# Patient Record
Sex: Female | Born: 1995 | Race: Black or African American | Hispanic: No | Marital: Married | Smoking: Never smoker
Health system: Southern US, Community
[De-identification: ages and names within clinical notes are randomized; demographics above are authoritative.]

## PROBLEM LIST (undated history)

## (undated) DIAGNOSIS — F909 Attention-deficit hyperactivity disorder, unspecified type: Secondary | ICD-10-CM

## (undated) HISTORY — PX: INTRAUTERINE DEVICE INSERTION: SHX323

---

## 2016-08-11 DIAGNOSIS — D509 Iron deficiency anemia, unspecified: Secondary | ICD-10-CM | POA: Insufficient documentation

## 2016-08-11 DIAGNOSIS — J4540 Moderate persistent asthma, uncomplicated: Secondary | ICD-10-CM | POA: Insufficient documentation

## 2016-09-08 DIAGNOSIS — F411 Generalized anxiety disorder: Secondary | ICD-10-CM | POA: Insufficient documentation

## 2017-11-27 DIAGNOSIS — J45909 Unspecified asthma, uncomplicated: Secondary | ICD-10-CM | POA: Insufficient documentation

## 2017-11-27 DIAGNOSIS — D249 Benign neoplasm of unspecified breast: Secondary | ICD-10-CM | POA: Insufficient documentation

## 2019-02-26 ENCOUNTER — Encounter: Payer: Self-pay | Admitting: Sports Medicine

## 2019-02-26 ENCOUNTER — Ambulatory Visit (INDEPENDENT_AMBULATORY_CARE_PROVIDER_SITE_OTHER): Payer: 59 | Admitting: Sports Medicine

## 2019-02-26 ENCOUNTER — Other Ambulatory Visit: Payer: Self-pay

## 2019-02-26 ENCOUNTER — Ambulatory Visit (INDEPENDENT_AMBULATORY_CARE_PROVIDER_SITE_OTHER): Payer: 59

## 2019-02-26 ENCOUNTER — Other Ambulatory Visit: Payer: Self-pay | Admitting: Sports Medicine

## 2019-02-26 VITALS — BP 125/80 | Temp 97.3°F

## 2019-02-26 DIAGNOSIS — M25572 Pain in left ankle and joints of left foot: Secondary | ICD-10-CM

## 2019-02-26 DIAGNOSIS — M2142 Flat foot [pes planus] (acquired), left foot: Secondary | ICD-10-CM

## 2019-02-26 DIAGNOSIS — M2140 Flat foot [pes planus] (acquired), unspecified foot: Secondary | ICD-10-CM

## 2019-02-26 MED ORDER — MELOXICAM 7.5 MG PO TABS: 7.5000 mg | ORAL_TABLET | Freq: Every day | ORAL | 0 refills | Status: DC

## 2019-02-26 NOTE — Progress Notes (Signed)
Subjective:  Julie Hudson is a 23 y.o. female patient who presents to office for evaluation of left ankle pain. Patient complains of continued pain in the ankle for the last 3 weeks. Patient has tried icing with no relief in symptoms. Patient denies any other pedal complaints. Denies injury/trip/fall/sprain/any causative factors.   Review of Systems  All other systems reviewed and are negative.    There are no active problems to display for this patient.   Current Outpatient Medications on File Prior to Visit  Medication Sig Dispense Refill  . albuterol (VENTOLIN HFA) 108 (90 Base) MCG/ACT inhaler Inhale into the lungs.    . Norelgestromin-Eth Estradiol Julie Hudson TD) Place onto the skin.    Marland Kitchen Phentermine HCl (ADIPEX-P PO) Take by mouth.     No current facility-administered medications on file prior to visit.     Allergies  Allergen Reactions  . Fish Allergy Anaphylaxis  . Peanut Oil Anaphylaxis    Objective:  General: Alert and oriented x3 in no acute distress  Dermatology: No open lesions bilateral lower extremities, no webspace macerations, no ecchymosis bilateral, all nails x 10 are well manicured.  Vascular: Dorsalis Pedis and Posterior Tibial pedal pulses palpable, Capillary Fill Time 3 seconds,(+) pedal hair growth bilateral, no edema bilateral lower extremities, Temperature gradient within normal limits.  Neurology: Julie Hudson sensation intact via light touch bilateral.  Musculoskeletal: Mild tenderness with palpation at medial and lateral ankle gutters with marked pes planus deformity with forefoot abduction and lateral impaction at the lateral ankle left greater than right. Negative talar tilt, Negative tib-fib stress, No instability. No pain with calf compression bilateral. Range of motion within normal limits with mild guarding on left ankle. Strength within normal limits in all groups bilateral.   Gait: Antalgic gait  Xrays  Left Ankle   Impression: Normal osseous  mineralization on the lateral view there is significant promontory influence with midtarsal collapse supportive of pes planus deformity with forefoot abduction.  Assessment and Plan: Problem List Items Addressed This Visit    None    Visit Diagnoses    Left ankle pain, unspecified chronicity    -  Primary   Relevant Medications   meloxicam (MOBIC) 7.5 MG tablet   Pes planus, unspecified laterality          -Complete examination performed -Xrays reviewed -Discussed treatement options for pain likely secondary to phonatory flatfoot with impingement -Rx meloxicam for patient to take daily -Recommend good supportive shoes for foot type and advised patient if fails to continue to improve may benefit from custom functional foot orthotics; Dawn to call patient to arrange casting after benefits are discussed -Patient to return to office for Orthotics or sooner if condition worsens.  Landis Martins, DPM

## 2019-03-11 ENCOUNTER — Telehealth: Payer: Self-pay | Admitting: Sports Medicine

## 2019-03-11 ENCOUNTER — Other Ambulatory Visit: Payer: Self-pay | Admitting: Sports Medicine

## 2019-03-11 MED ORDER — MELOXICAM 15 MG PO TABS: 15.0000 mg | ORAL_TABLET | Freq: Every day | ORAL | 0 refills | Status: DC

## 2019-03-11 NOTE — Progress Notes (Signed)
Sent 15 mg to pharmacy

## 2019-03-11 NOTE — Telephone Encounter (Signed)
Sent the 15mg  strength to her pharmacy

## 2019-03-11 NOTE — Telephone Encounter (Signed)
Pt returned my call regarding orthotics and is aware they are not covered under her plan and is aware of the cost and payment plan.  Pt then asked if I could let you know that the meloxicam 7.5 is not working and she wanted to know if you could increase the dosage or change to a different medication. [facility] [address] [phone].

## 2019-03-20 ENCOUNTER — Other Ambulatory Visit: Payer: Self-pay | Admitting: Sports Medicine

## 2019-03-20 DIAGNOSIS — M25572 Pain in left ankle and joints of left foot: Secondary | ICD-10-CM

## 2019-04-01 ENCOUNTER — Other Ambulatory Visit: Payer: Self-pay | Admitting: Sports Medicine

## 2019-04-02 ENCOUNTER — Telehealth: Payer: Self-pay | Admitting: *Deleted

## 2019-04-02 DIAGNOSIS — F902 Attention-deficit hyperactivity disorder, combined type: Secondary | ICD-10-CM | POA: Insufficient documentation

## 2019-04-02 DIAGNOSIS — F909 Attention-deficit hyperactivity disorder, unspecified type: Secondary | ICD-10-CM | POA: Insufficient documentation

## 2019-04-02 NOTE — Telephone Encounter (Signed)
Dr. Cannon Kettle approved patient to receive a refill for:  Meloxicam 7.5 mg Dispensed 30 with 1 refill  This was faxed over to patient's pharmacy at:  [place] [place], [place]

## 2020-09-18 ENCOUNTER — Emergency Department (HOSPITAL_BASED_OUTPATIENT_CLINIC_OR_DEPARTMENT_OTHER)
Admission: EM | Admit: 2020-09-18 | Discharge: 2020-09-18 | Disposition: A | Discharge status: Home / Self Care | Payer: 59 | Attending: Emergency Medicine | Admitting: Emergency Medicine

## 2020-09-18 ENCOUNTER — Encounter (HOSPITAL_BASED_OUTPATIENT_CLINIC_OR_DEPARTMENT_OTHER): Payer: Self-pay | Admitting: *Deleted

## 2020-09-18 ENCOUNTER — Other Ambulatory Visit: Payer: Self-pay

## 2020-09-18 ENCOUNTER — Emergency Department (HOSPITAL_BASED_OUTPATIENT_CLINIC_OR_DEPARTMENT_OTHER): Payer: 59

## 2020-09-18 DIAGNOSIS — Z91010 Allergy to peanuts: Secondary | ICD-10-CM | POA: Diagnosis not present

## 2020-09-18 DIAGNOSIS — M62830 Muscle spasm of back: Secondary | ICD-10-CM | POA: Insufficient documentation

## 2020-09-18 DIAGNOSIS — N939 Abnormal uterine and vaginal bleeding, unspecified: Secondary | ICD-10-CM | POA: Diagnosis not present

## 2020-09-18 DIAGNOSIS — M542 Cervicalgia: Secondary | ICD-10-CM | POA: Insufficient documentation

## 2020-09-18 DIAGNOSIS — R109 Unspecified abdominal pain: Secondary | ICD-10-CM | POA: Insufficient documentation

## 2020-09-18 HISTORY — DX: Attention-deficit hyperactivity disorder, unspecified type: F90.9

## 2020-09-18 LAB — BASIC METABOLIC PANEL
Anion gap: 9 (ref 5–15)
BUN: 12 mg/dL (ref 6–20)
CO2: 25 mmol/L (ref 22–32)
Calcium: 8.9 mg/dL (ref 8.9–10.3)
Chloride: 104 mmol/L (ref 98–111)
Creatinine, Ser: 0.9 mg/dL (ref 0.44–1.00)
GFR, Estimated: >60 mL/min (ref >60)
Glucose, Bld: 87 mg/dL (ref 70–99)
Potassium: 3.8 mmol/L (ref 3.5–5.1)
Sodium: 138 mmol/L (ref 135–145)

## 2020-09-18 LAB — URINALYSIS, ROUTINE W REFLEX MICROSCOPIC
Bilirubin Urine: NEGATIVE
Glucose, UA: NEGATIVE mg/dL
Ketones, ur: NEGATIVE mg/dL
Leukocytes,Ua: NEGATIVE
Nitrite: NEGATIVE
Protein, ur: NEGATIVE mg/dL
Specific Gravity, Urine: 1.025 (ref 1.005–1.030)
pH: 6 (ref 5.0–8.0)

## 2020-09-18 LAB — CBC WITH DIFFERENTIAL/PLATELET
Abs Immature Granulocytes: 0.04 10*3/uL (ref 0.00–0.07)
Basophils Absolute: 0.1 10*3/uL (ref 0.0–0.1)
Basophils Relative: 1 %
Eosinophils Absolute: 0.6 10*3/uL — ABNORMAL HIGH (ref 0.0–0.5)
Eosinophils Relative: 8 %
HCT: 42.6 % (ref 36.0–46.0)
Hemoglobin: 13.8 g/dL (ref 12.0–15.0)
Immature Granulocytes: 1 %
Lymphocytes Relative: 24 %
Lymphs Abs: 1.8 10*3/uL (ref 0.7–4.0)
MCH: 30.9 pg (ref 26.0–34.0)
MCHC: 32.4 g/dL (ref 30.0–36.0)
MCV: 95.3 fL (ref 80.0–100.0)
Monocytes Absolute: 0.9 10*3/uL (ref 0.1–1.0)
Monocytes Relative: 11 %
Neutro Abs: 4.2 10*3/uL (ref 1.7–7.7)
Neutrophils Relative %: 55 %
Platelets: 403 10*3/uL — ABNORMAL HIGH (ref 150–400)
RBC: 4.47 MIL/uL (ref 3.87–5.11)
RDW: 12.9 % (ref 11.5–15.5)
WBC: 7.6 10*3/uL (ref 4.0–10.5)
nRBC: 0 % (ref 0.0–0.2)

## 2020-09-18 LAB — URINALYSIS, MICROSCOPIC (REFLEX): RBC / HPF: >50 RBC/hpf (ref 0–5)

## 2020-09-18 LAB — PREGNANCY, URINE: Preg Test, Ur: NEGATIVE

## 2020-09-18 MED ORDER — MEGESTROL ACETATE 40 MG PO TABS: 40.0000 mg | ORAL_TABLET | Freq: Three times a day (TID) | ORAL | 0 refills | Status: AC

## 2020-09-18 MED ORDER — SODIUM CHLORIDE 0.9 % IV BOLUS
1000.0000 mL | Freq: Once | INTRAVENOUS | Status: AC
  Administered 2020-09-18: 1000 mL via INTRAVENOUS

## 2020-09-18 NOTE — ED Triage Notes (Signed)
Vaginal bleeding x 3 months. She is being treated for the bleeding with hormones with no improvement. Abdominal and back pain.

## 2020-09-18 NOTE — ED Notes (Signed)
In [place], will get vitals when she returns from radiology

## 2020-09-18 NOTE — ED Provider Notes (Signed)
[facility] EMERGENCY DEPARTMENT Provider Note   CSN: [id number] Arrival date & time: 09/18/20  1314     History Chief Complaint  Patient presents with  . Vaginal Bleeding    Julie Hudson is a 25 y.o. female who presents with concern for nearly 6 weeks of persistent vaginal bleeding.  Patient's been seen at multiple urgent cares and at OB/GYN and has been prescribed Aygestin, which lightened her uterine bleeding, but caused her severe headaches.  She was changed to Provera without improvement in her bleeding.  Patient with history of the same approximately 10 months ago, refractory to Provera but resolved with Aygestin.  At that time she had no adverse reactions to Aygestin.  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  She endorses significant discomfort with pelvic exams as well.  Currently using vaginal dilator to try to prove her vaginismus symptoms.  Patient is not sexually active due to her vaginismus, denies any new vaginal discharge, states she is changing her pad 3 times a day at this point but was changing it 3-4 times per day for the few weeks prior to today.  Additionally patient endorses progressively worsening mid back and neck pain for the last several weeks, sensation of tightness in her muscles. Denies any numbness, tingling, weakness in her upper extremities, denies any recent trauma or change in activity, denies any tenderness on her spine itself.  I personally read this patient's medical records.  She is history of abnormal uterine bleeding and severe food allergies, as well as ADHD on Adderall.  HPI     Past Medical History:  Diagnosis Date  . ADHD     There are no problems to display for this patient.   History reviewed. No pertinent surgical history.   OB History   No obstetric history on file.     No family history on file.  Social History   Tobacco Use  . Smoking status: Never  Smoker  . Smokeless tobacco: Never Used  Substance Use Topics  . Alcohol use: Yes  . Drug use: Never    Home Medications Prior to Admission medications   Medication Sig Start Date End Date Taking? Authorizing Provider  albuterol (VENTOLIN HFA) 108 (90 Base) MCG/ACT inhaler Inhale into the lungs. 09/29/16  Yes [provider]  megestrol (MEGACE) 40 MG tablet Take 1 tablet (40 mg total) by mouth in the morning, at noon, and at bedtime for 7 days. 09/18/20 09/25/20 Yes Jenne Sellinger, Eugene Gavia, PA-C  meloxicam (MOBIC) 15 MG tablet TAKE 1 TABLET BY MOUTH EVERY DAY 04/01/19   Asencion Islam, DPM  Phentermine HCl (ADIPEX-P PO) Take by mouth.    [provider]    Allergies    Fish allergy and Peanut oil  Review of Systems   Review of Systems  Constitutional: Negative.   HENT: Negative.   Respiratory: Negative.   Cardiovascular: Negative.   Gastrointestinal: Negative for abdominal pain, blood in stool, constipation, diarrhea, nausea and vomiting.  Genitourinary: Positive for dyspareunia, menstrual problem, pelvic pain, vaginal bleeding and vaginal pain. Negative for decreased urine volume, dysuria, enuresis, flank pain, frequency, genital sores, hematuria, urgency and vaginal discharge.  Musculoskeletal: Positive for back pain.  Skin: Negative.   Neurological: Negative.   Psychiatric/Behavioral: Negative.     Physical Exam Updated Vital Signs BP (!) 122/91 (BP Location: Right Arm)   Pulse (!) 107   Temp 98.3 F (36.8 C) (Oral)   Resp 18  Ht 5\' 2"  (1.575 m)   Wt 88.6 kg   LMP 08/23/2020   SpO2 100%   BMI 35.74 kg/m   Physical Exam Vitals and nursing note reviewed.  HENT:     Head: Normocephalic and atraumatic.     Nose: Nose normal.     Mouth/Throat:     Mouth: Mucous membranes are moist.     Pharynx: Oropharynx is clear. Uvula midline. No oropharyngeal exudate or posterior oropharyngeal erythema.     Tonsils: No tonsillar exudate.  Eyes:     General: Lids  are normal. Vision grossly intact.        Right eye: No discharge.        Left eye: No discharge.     Conjunctiva/sclera: Conjunctivae normal.     Pupils: Pupils are equal, round, and reactive to light.  Neck:     Trachea: Trachea and phonation normal.  Cardiovascular:     Rate and Rhythm: Normal rate and regular rhythm.     Pulses: Normal pulses.          Radial pulses are 2+ on the right side and 2+ on the left side.       Dorsalis pedis pulses are 2+ on the right side and 2+ on the left side.     Heart sounds: Normal heart sounds. No murmur heard.   Pulmonary:     Effort: Pulmonary effort is normal. No respiratory distress.     Breath sounds: Normal breath sounds. No wheezing or rales.  Chest:     Chest wall: No deformity, swelling, tenderness or crepitus.  Abdominal:     General: Bowel sounds are normal. There is no distension.     Palpations: Abdomen is soft.     Tenderness: There is abdominal tenderness in the suprapubic area. There is no right CVA tenderness or left CVA tenderness.  Genitourinary:    Comments: GU exam deferred given patient's hx of vaginismus, exam not tolerable. Recent normal exam at outpatient facility last week.  Musculoskeletal:        General: No deformity.     Cervical back: Neck supple. Spasms and tenderness present. No bony tenderness or crepitus. No pain with movement or spinous process tenderness.     Thoracic back: Spasms and tenderness present. No bony tenderness.     Lumbar back: Spasms present. No tenderness or bony tenderness.     Right lower leg: No edema.     Left lower leg: No edema.     Comments: Bilateral cervical and thoracic paraspinous musculature spasm and tenderness to palpation. Bilateral trapezius spasm and tenderness to palpation. No midline tenderness to palpation. No trigger point.  Lymphadenopathy:     Cervical: No cervical adenopathy.  Skin:    General: Skin is warm and dry.     Capillary Refill: Capillary refill takes less  than 2 seconds.  Neurological:     General: No focal deficit present.     Mental Status: She is alert and oriented to person, place, and time. Mental status is at baseline.     Sensory: Sensation is intact.     Motor: Motor function is intact.     Gait: Gait is intact.  Psychiatric:        Mood and Affect: Mood normal.     ED Results / Procedures / Treatments   Labs (all labs ordered are listed, but only abnormal results are displayed) Labs Reviewed  CBC WITH DIFFERENTIAL/PLATELET - Abnormal; Notable for the following components:  Result Value   Platelets 403 (*)    Eosinophils Absolute 0.6 (*)    All other components within normal limits  URINALYSIS, ROUTINE W REFLEX MICROSCOPIC - Abnormal; Notable for the following components:   Color, Urine AMBER (*)    APPearance HAZY (*)    Hgb urine dipstick LARGE (*)    All other components within normal limits  URINALYSIS, MICROSCOPIC (REFLEX) - Abnormal; Notable for the following components:   Bacteria, UA RARE (*)    All other components within normal limits  BASIC METABOLIC PANEL  PREGNANCY, URINE    EKG None  Radiology US Pelvis Complete  Result Date: 09/18/2020 CLINICAL DATA:  Vaginal bleeding for 1 month, back pain EXAM: TRANSABDOMINAL ULTRASOUND OF PELVIS TECHNIQUE: Transabdominal ultrasound examination of the pelvis was performed including evaluation of the uterus, ovaries, adnexal regions, and pelvic cul-de-sac. COMPARISON:  None. FINDINGS: Uterus Measurements: Uterus measures 7.7 x 3.9 by 4.5 cm = volume: 71.0 mL. No gross abnormalities. Evaluation is limited due to patient body habitus. The patient refused endovaginal exam. Endometrium Thickness: 9 mm. No gross abnormality identified. Evaluation limited due to body habitus. The patient refused endovaginal exam. Right ovary Measurements: 2.9 x 1.5 x 1.9 cm = volume: 4.3 mL. Normal appearance/no adnexal mass. Left ovary Measurements: 2.9 x 1.6 x 2.2 cm = volume: 5.1 mL.  Normal appearance/no adnexal mass. Other findings: No free fluid. The bladder is grossly unremarkable. IMPRESSION: 1. Limited study due to body habitus. The patient refused endovaginal examination. 2. Age-appropriate pelvic ultrasound. Electronically Signed   By: Sharlet Salina M.D.   On: 09/18/2020 18:01    Procedures Procedures  Medications Ordered in ED Medications  sodium chloride 0.9 % bolus 1,000 mL (0 mLs Intravenous Stopped 09/18/20 1830)    ED Course  I have reviewed the triage vital signs and the nursing notes.  Pertinent labs & imaging results that were available during my care of the patient were reviewed by me and considered in my medical decision making (see chart for details).    MDM Rules/Calculators/A&P                         25 year old female with history of abnormal brain who presents with 6 weeks of vaginal bleeding refractory to Provera and Aygestin.  Some associated suprapubic tenderness palpation.  Tachycardic to 130 hypertensive on intake, vital signs otherwise normal, at time of my exam patient remains mildly tachycardic with heart rate around 110, pulmonary exam is normal, abdominal exam with suprapubic tenderness to palpation.  GU exam deferred given patient's baseline vaginismus and recent normal pelvic exam last week. MSK exam revealed cervical and thoracic paraspinous muscular spasm and tenderness to palpation, without midline tenderness. No concern for STI given patient is not sexually active given her vaginismus.  Will proceed with basic laboratory studies to confirm patient is not severely anemic as well as transabdominal pelvic ultrasound.  CBC unremarkable, hemoglobin normal at 13.8, BMP unremarkable. UA with  large amount of blood, rare bacteria, not concerning for infection at this time.  Pelvic ultrasound performed, however limited due to patient's body habitus and inability to perform transvaginal portion.  Visualized portions of the pelvis revealed  age-appropriate pelvic exam.  Consult placed to on-call OB/GYN, Dr. Elroy Channel, regarding appropriate next step for treatment of abnormal uterine bleeding.  He recommends prescription for Megace 40 mg 3 times daily x7 days as well as OB/GYN follow-up within the week.  Appreciate his collaboration of  care of this patient.  Suspect patient's back pain is secondary to muscle spasm, likely secondary to poor posture.  Education given regarding topical analgesia and application of heat and massage for release of muscle spasm.  No further work-up is warranted in ED at this time given reassuring physical exam, vital signs, laboratory studies.  Suspect patient's back pain is due to muscular spasm and poor posture.  Etiology of abnormal uterine bleeding remains unclear, however there does not appear to be any emergent source for this patient's issue and her hemoglobin is stable at this time.  Borderline tachycardia with heart rate of 105; recommend patient follow-up with OB/GYN within the week.  May need evaluation of thyroid function.  Ernisha voiced understanding of her medical evaluation treatment plan.  Each of her questions was answered to her expressed aspect.  Return precautions given.  Patient is well-appearing, stable, and appropriate for discharge at this time.  This chart was dictated using voice recognition software, Dragon. Despite the best efforts of this provider to proofread and correct errors, errors may still occur which can change documentation meaning.  Final Clinical Impression(s) / ED Diagnoses Final diagnoses:  Abnormal uterine bleeding  Spasm of thoracic back muscle    Rx / DC Orders ED Discharge Orders         Ordered    megestrol (MEGACE) 40 MG tablet  3 times daily        09/18/20 1855           Chaim Gatley, Eugene GaviaRebekah R, PA-C 09/18/20 1944    Charlynne PanderYao, David Hsienta, MD 09/19/20 1450

## 2020-09-18 NOTE — Discharge Instructions (Addendum)
You were seen in the emergency department for your vaginal bleeding and your back pain.  Your blood work, vital signs, physical exam, and abdominal ultrasound were reassuring.  I discussed your case with the on-call OB/GYN who recommended changing you to a medication called Megace, which you will take 3 times a day until your bleeding stops.  Please discontinue the other medications prescribed to you for your vaginal bleeding.  You should be seen by your OB/GYN within the next week for follow-up.  The muscles in your mid back and neck are in what is called spasm, meaning they are inappropriately tightened up.  This can be quite painful.  To help with your pain you may take Tylenol and / or NSAID medication (such as ibuprofen or naproxen) to help with your pain.    You may also utilize topical pain relief such as Biofreeze, IcyHot, or topical lidocaine patches.  I also recommend that you apply heat to the area, such as a hot shower or heating pad, and follow heat application with massage of the muscles that are most tight.  Please return to the emergency department if you develop any numbness/tingling/weakness in your arms or legs, any difficulty urinating, or urinary incontinence chest pain, shortness of breath, abdominal pain, nausea or vomiting that does not stop, severe pelvic pain, for vaginal bleeding persists or becomes heavier, or any other new severe symptoms.

## 2020-09-20 ENCOUNTER — Emergency Department (HOSPITAL_BASED_OUTPATIENT_CLINIC_OR_DEPARTMENT_OTHER): Payer: 59

## 2020-09-20 ENCOUNTER — Encounter (HOSPITAL_BASED_OUTPATIENT_CLINIC_OR_DEPARTMENT_OTHER): Payer: Self-pay | Admitting: Emergency Medicine

## 2020-09-20 ENCOUNTER — Emergency Department (HOSPITAL_BASED_OUTPATIENT_CLINIC_OR_DEPARTMENT_OTHER)
Admission: EM | Admit: 2020-09-20 | Discharge: 2020-09-20 | Disposition: A | Discharge status: Home / Self Care | Payer: 59 | Attending: Emergency Medicine | Admitting: Emergency Medicine

## 2020-09-20 ENCOUNTER — Other Ambulatory Visit: Payer: Self-pay

## 2020-09-20 DIAGNOSIS — R0602 Shortness of breath: Secondary | ICD-10-CM | POA: Diagnosis not present

## 2020-09-20 DIAGNOSIS — N939 Abnormal uterine and vaginal bleeding, unspecified: Secondary | ICD-10-CM | POA: Diagnosis not present

## 2020-09-20 DIAGNOSIS — Z91040 Latex allergy status: Secondary | ICD-10-CM | POA: Diagnosis not present

## 2020-09-20 DIAGNOSIS — G8929 Other chronic pain: Secondary | ICD-10-CM | POA: Diagnosis not present

## 2020-09-20 DIAGNOSIS — Z20822 Contact with and (suspected) exposure to covid-19: Secondary | ICD-10-CM | POA: Insufficient documentation

## 2020-09-20 DIAGNOSIS — R000 Tachycardia, unspecified: Secondary | ICD-10-CM | POA: Diagnosis not present

## 2020-09-20 DIAGNOSIS — R079 Chest pain, unspecified: Secondary | ICD-10-CM | POA: Diagnosis not present

## 2020-09-20 DIAGNOSIS — M549 Dorsalgia, unspecified: Secondary | ICD-10-CM | POA: Diagnosis not present

## 2020-09-20 LAB — CBC
HCT: 43 % (ref 36.0–46.0)
Hemoglobin: 14.1 g/dL (ref 12.0–15.0)
MCH: 30.9 pg (ref 26.0–34.0)
MCHC: 32.8 g/dL (ref 30.0–36.0)
MCV: 94.1 fL (ref 80.0–100.0)
Platelets: 412 10*3/uL — ABNORMAL HIGH (ref 150–400)
RBC: 4.57 MIL/uL (ref 3.87–5.11)
RDW: 12.6 % (ref 11.5–15.5)
WBC: 6.9 10*3/uL (ref 4.0–10.5)
nRBC: 0 % (ref 0.0–0.2)

## 2020-09-20 LAB — BASIC METABOLIC PANEL
Anion gap: 11 (ref 5–15)
BUN: 13 mg/dL (ref 6–20)
CO2: 20 mmol/L — ABNORMAL LOW (ref 22–32)
Calcium: 9.3 mg/dL (ref 8.9–10.3)
Chloride: 104 mmol/L (ref 98–111)
Creatinine, Ser: 0.81 mg/dL (ref 0.44–1.00)
GFR, Estimated: >60 mL/min (ref >60)
Glucose, Bld: 112 mg/dL — ABNORMAL HIGH (ref 70–99)
Potassium: 3.8 mmol/L (ref 3.5–5.1)
Sodium: 135 mmol/L (ref 135–145)

## 2020-09-20 LAB — TROPONIN I (HIGH SENSITIVITY): Troponin I (High Sensitivity): <2 ng/L (ref <18)

## 2020-09-20 LAB — PREGNANCY, URINE: Preg Test, Ur: NEGATIVE

## 2020-09-20 MED ORDER — LIDOCAINE 5 % EX PTCH: 1.0000 | MEDICATED_PATCH | CUTANEOUS | 0 refills | Status: DC

## 2020-09-20 NOTE — ED Notes (Signed)
Concerned that she has covid - states back pain, cough, congestion and runny nose since Tuesday. Denies fevers.

## 2020-09-20 NOTE — ED Notes (Signed)
Covid Swab obtained and to the lab 

## 2020-09-20 NOTE — ED Provider Notes (Signed)
[facility] EMERGENCY DEPARTMENT Provider Note   CSN: [id number] Arrival date & time: 09/20/20  1830     History Chief Complaint  Patient presents with  . Vaginal Bleeding  . Shortness of Breath  . Abdominal Cramping    Julie Hudson is a 25 y.o. female hx of ADHD, menorrhagia here with multiple complaints. Patient has been having vaginal bleeding for several weeks. Was seen here  2 days ago and [place] was unremarkable. She was prescribed megace. Started taking it and was getting chest pains since yesterday. Subjective shortness of breath as well. 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 She also has possible covid exposure. She is fully vaccinated and went back to [place] and went to [employer]. She also has lower back pain for several weeks and tried tylenol and icy hot patches with no relief. She states that she had some numbness this morning when she woke up but has no numbness right now. Denies history of stroke.   The history is provided by the patient.       Past Medical History:  Diagnosis Date  . ADHD     There are no problems to display for this patient.   History reviewed. No pertinent surgical history.   OB History   No obstetric history on file.     No family history on file.  Social History   Tobacco Use  . Smoking status: Never Smoker  . Smokeless tobacco: Never Used  Substance Use Topics  . Alcohol use: Yes  . Drug use: Never    Home Medications Prior to Admission medications   Medication Sig Start Date End Date Taking? Authorizing Provider  lidocaine (LIDODERM) 5 % Place 1 patch onto the skin daily. Remove & Discard patch within 12 hours or as directed by MD 09/20/20  Yes Charlynne Pander, MD  albuterol (VENTOLIN HFA) 108 (90 Base) MCG/ACT inhaler Inhale into the lungs. 09/29/16   [provider]  megestrol (MEGACE) 40 MG tablet Take 1 tablet (40 mg total) by  mouth in the morning, at noon, and at bedtime for 7 days. 09/18/20 09/25/20  Sponseller, Lupe Carney R, PA-C  meloxicam (MOBIC) 15 MG tablet TAKE 1 TABLET BY MOUTH EVERY DAY 04/01/19   Asencion Islam, DPM  Phentermine HCl (ADIPEX-P PO) Take by mouth.    [provider]    Allergies    Fish allergy and Peanut oil  Review of Systems   Review of Systems  Respiratory: Positive for shortness of breath.   Genitourinary: Positive for vaginal bleeding.  All other systems reviewed and are negative.   Physical Exam Updated Vital Signs BP 139/89 (BP Location: Right Arm)   Pulse 95   Temp 98.5 F (36.9 C) (Oral)   Resp 18   Ht 5\' 2"  (1.575 m)   Wt 86.2 kg   LMP 08/23/2020   SpO2 100%   BMI 34.75 kg/m   Physical Exam Vitals and nursing note reviewed.  Constitutional:      Comments: Slightly uncomfortable   HENT:     Head: Normocephalic.  Eyes:     Pupils: Pupils are equal, round, and reactive to light.  Cardiovascular:     Rate and Rhythm: Regular rhythm. Tachycardia present.  Pulmonary:     Effort: Pulmonary effort is normal.     Breath sounds: Normal breath sounds.  Abdominal:     General: Bowel sounds  are normal.     Palpations: Abdomen is soft.  Musculoskeletal:        General: Normal range of motion.     Cervical back: Normal range of motion.  Skin:    General: Skin is warm.     Capillary Refill: Capillary refill takes less than 2 seconds.  Neurological:     General: No focal deficit present.     Mental Status: She is alert and oriented to person, place, and time.  Psychiatric:        Mood and Affect: Mood normal.        Behavior: Behavior normal.     ED Results / Procedures / Treatments   Labs (all labs ordered are listed, but only abnormal results are displayed) Labs Reviewed  BASIC METABOLIC PANEL - Abnormal; Notable for the following components:      Result Value   CO2 20 (*)    Glucose, Bld 112 (*)    All other components within normal limits  CBC -  Abnormal; Notable for the following components:   Platelets 412 (*)    All other components within normal limits  SARS CORONAVIRUS 2 (TAT 6-24 HRS)  PREGNANCY, URINE  TROPONIN I (HIGH SENSITIVITY)  TROPONIN I (HIGH SENSITIVITY)    EKG EKG Interpretation  Date/Time:  Sunday September 20 2020 18:48:04 EST Ventricular Rate:  123 PR Interval:  114 QRS Duration: 74 QT Interval:  318 QTC Calculation: 455 R Axis:   87 Text Interpretation: Sinus tachycardia T wave abnormality, consider anterior ischemia Abnormal ECG No previous ECGs available Confirmed by Richardean Canal [phone]) on 09/20/2020 9:25:52 PM   Radiology DG Chest 2 View  Result Date: 09/20/2020 CLINICAL DATA:  Chest pain and shortness of breath. EXAM: CHEST - 2 VIEW COMPARISON:  None. FINDINGS: The cardiomediastinal contours are normal. The lungs are clear. Pulmonary vasculature is normal. No consolidation, pleural effusion, or pneumothorax. No acute osseous abnormalities are seen. IMPRESSION: Negative radiographs of the chest. Electronically Signed   By: Narda Rutherford M.D.   On: 09/20/2020 19:35    Procedures Procedures   Medications Ordered in ED Medications - No data to display  ED Course  I have reviewed the triage vital signs and the nursing notes.  Pertinent labs & imaging results that were available during my care of the patient were reviewed by me and considered in my medical decision making (see chart for details).    MDM Rules/Calculators/A&P                         Julie Hudson is a 25 y.o. female here with constellation of complaints.  1.  She has vaginal bleeding for several weeks and recently had [place] and seen by OB and is on megace. Her Hg today is 14 which is better than 2 days ago.   2. Possible COVID exposure in [place]- not hypoxic and CXR is clear. COVID test send and told her to stay home per guidelines if positive. She is fully vaccinated.   3. Chronic back pain- no red flags. Will try lidocaine  patch   4. Chest pain this morning- likely medication side effect. Told her to stop taking megace and call her GYN   5. Numbness- Intermittent numbness. No numbness on my exam, no neuro deficits. Electrolytes unremarkable. She has exam tomorrow and appears anxious. No signs of stroke.    Final Clinical Impression(s) / ED Diagnoses Final diagnoses:  Vaginal bleeding  COVID-19 virus RNA test result unknown  Back pain, unspecified back location, unspecified back pain laterality, unspecified chronicity    Rx / DC Orders ED Discharge Orders         Ordered    lidocaine (LIDODERM) 5 %  Every 24 hours        09/20/20 2137           Charlynne Pander, MD 09/20/20 2146

## 2020-09-20 NOTE — ED Triage Notes (Signed)
Seen two days ago for vaginal bleeding.  Today having multiple complaints.  Reports having intermittent numbness to left side of her body, intermittent chest pain, sob, reports period has been going on for a month.

## 2020-09-20 NOTE — ED Notes (Signed)
AVS and Rx x 1 provided to pt as written by ED Provider. Opportunity for questions provided

## 2020-09-20 NOTE — Discharge Instructions (Addendum)
Your hemoglobin is better than earlier this week. You may have side effects from megace. Stop it for now and call your GYN doctor about treatment options for your vaginal bleeding   Apply lidocaine patch to the back   Take tylenol for pain   You were tested for COVID. If you have COVID, you will need to quarantine per guidelines   See your GYN doctor   Return to ER if you have worse vaginal bleeding, abdominal pain, vomiting, fever, trouble breathing.

## 2020-09-21 LAB — SARS CORONAVIRUS 2 (TAT 6-24 HRS): SARS Coronavirus 2: NEGATIVE

## 2021-04-05 ENCOUNTER — Ambulatory Visit
Admission: EM | Admit: 2021-04-05 | Discharge: 2021-04-05 | Disposition: A | Discharge status: Home / Self Care | Payer: 59

## 2021-04-05 ENCOUNTER — Other Ambulatory Visit: Payer: Self-pay

## 2021-04-05 DIAGNOSIS — M7918 Myalgia, other site: Secondary | ICD-10-CM

## 2021-04-05 DIAGNOSIS — J069 Acute upper respiratory infection, unspecified: Secondary | ICD-10-CM | POA: Diagnosis not present

## 2021-04-05 MED ORDER — OLOPATADINE HCL 0.1 % OP SOLN: 1.0000 [drp] | Freq: Two times a day (BID) | OPHTHALMIC | 0 refills | Status: DC

## 2021-04-05 MED ORDER — NAPROXEN 500 MG PO TABS: 500.0000 mg | ORAL_TABLET | Freq: Two times a day (BID) | ORAL | 0 refills | Status: DC

## 2021-04-05 MED ORDER — FLUTICASONE PROPIONATE 50 MCG/ACT NA SUSP: 1.0000 | Freq: Every day | NASAL | 0 refills | Status: DC

## 2021-04-05 NOTE — Discharge Instructions (Addendum)
COVID test pending, monitor MyChart for results Rest and fluids Restart daily Allegra-D Trial Flonase nasal spray 1 to 2 spray in each nostril daily Naprosyn as needed for gluteal pain/headache/sinus pressure Rest and fluids Please follow-up if not improving or worsening
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
## 2021-04-06 LAB — NOVEL CORONAVIRUS, NAA: SARS-CoV-2, NAA: NOT DETECTED

## 2021-04-06 LAB — SARS-COV-2, NAA 2 DAY TAT

## 2021-04-20 ENCOUNTER — Other Ambulatory Visit: Payer: Self-pay

## 2021-04-20 ENCOUNTER — Ambulatory Visit
Admission: RE | Admit: 2021-04-20 | Discharge: 2021-04-20 | Disposition: A | Discharge status: Home / Self Care | Payer: 59 | Source: Ambulatory Visit | Attending: Emergency Medicine | Admitting: Emergency Medicine

## 2021-04-20 VITALS — BP 124/84 | HR 109 | Temp 98.3°F | Resp 18

## 2021-04-20 DIAGNOSIS — J309 Allergic rhinitis, unspecified: Secondary | ICD-10-CM

## 2021-04-20 DIAGNOSIS — F410 Panic disorder [episodic paroxysmal anxiety] without agoraphobia: Secondary | ICD-10-CM

## 2021-04-20 DIAGNOSIS — J4520 Mild intermittent asthma, uncomplicated: Secondary | ICD-10-CM | POA: Diagnosis not present

## 2021-04-20 DIAGNOSIS — F900 Attention-deficit hyperactivity disorder, predominantly inattentive type: Secondary | ICD-10-CM

## 2021-04-20 MED ORDER — ALBUTEROL SULFATE HFA 108 (90 BASE) MCG/ACT IN AERS
6.0000 | INHALATION_SPRAY | Freq: Once | RESPIRATORY_TRACT | Status: AC
  Administered 2021-04-20: 6 via RESPIRATORY_TRACT

## 2021-04-20 MED ORDER — FEXOFENADINE HCL 180 MG PO TABS: 180.0000 mg | ORAL_TABLET | Freq: Every day | ORAL | 0 refills | Status: DC

## 2021-04-20 MED ORDER — FLUTICASONE PROPIONATE 50 MCG/ACT NA SUSP: 1.0000 | Freq: Every day | NASAL | 0 refills | Status: DC

## 2021-04-20 MED ORDER — ALBUTEROL SULFATE 1.25 MG/3ML IN NEBU: 1.0000 | INHALATION_SOLUTION | Freq: Four times a day (QID) | RESPIRATORY_TRACT | 0 refills | Status: DC | PRN

## 2021-04-20 MED ORDER — ALBUTEROL SULFATE HFA 108 (90 BASE) MCG/ACT IN AERS: 2.0000 | INHALATION_SPRAY | RESPIRATORY_TRACT | 0 refills | Status: AC | PRN

## 2021-04-20 NOTE — Discharge Instructions (Addendum)
Resume Flonase and fexofenadine for your allergic rhinitis.  I have renewed your prescriptions for albuterol inhaler as well as nebulizer treatment.  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  Thank you for visiting urgent care today.

## 2021-04-20 NOTE — ED Triage Notes (Signed)
Pt reports this morning she believes she has a panic or asthma attack. She did take 2 puffs of her inhaler. Patient reports she has some chest tightness at this time with no SOB at this time. .  Patient displays no s/s of respiratory distress at this time. She is A&O x 4, and ambulatory.

## 2021-04-20 NOTE — ED Provider Notes (Signed)
[facility]    CSN: [id number] Arrival date & time: 04/20/21  1024      History   Chief Complaint Chief Complaint  Patient presents with   Chest Pain    HPI Julie Hudson is a 25 y.o. female.   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  Patient states she had a test this morning at 7:45 AM.  States that she was studying late last night with a fellow student, came home and went to bed around 12:30 AM, woke up at 5 to continue studying.  Patient states around 6:15 AM this morning she began to have a tightness in her chest and feel short of breath.  Patient states that she has a history of asthma and always has some albuterol nebulizer rest feels on hand, states she does not know how old they are, states she tried to neb treatments without any relief of the tightness in her chest or any improvement in her perceived work of breathing.  Patient also reports a history of asthma, states usually this gets worse around changing of the seasons, states she has been prescribed Flonase and Allegra in the past but is currently not taking either one of them.  Finally, patient reports a history of diagnosis of ADHD, states has been a few years since she has seen a provider for this or been prescribed Adderall, states she never liked taking the Adderall because it made her feel anxious and jumpy.  Patient states she has noticed that studying is very difficult for her, feels like she studies a lot but does not really master the material.  Patient states she has not yet reached out to student health services or the student union to see what services they might be able to provide to accommodate her ADHD.  Of note, patient is mildly tachycardic on arrival today, during our visit however her heart rate slowed to approximately 90 bpm.  The history is provided by the patient.   Past Medical History:  Diagnosis Date   ADHD      There are no problems to display for this patient.   History reviewed. No pertinent surgical history.  OB History   No obstetric history on file.      Home Medications    Prior to Admission medications   Medication Sig Start Date End Date Taking? Authorizing Provider  albuterol (ACCUNEB) 1.25 MG/3ML nebulizer solution Take 3 mLs (1.25 mg total) by nebulization every 6 (six) hours as needed for wheezing. 04/20/21  Yes Theadora Rama Scales, PA-C  fexofenadine (ALLEGRA) 180 MG tablet Take 1 tablet (180 mg total) by mouth daily. 04/20/21 07/19/21 Yes Theadora Rama Scales, PA-C  albuterol (VENTOLIN HFA) 108 (90 Base) MCG/ACT inhaler Inhale 2 puffs into the lungs every 4 (four) hours as needed for wheezing or shortness of breath. 04/20/21   Theadora Rama Scales, PA-C  amphetamine-dextroamphetamine (ADDERALL) 20 MG tablet Take by mouth.    [provider]  fluticasone (FLONASE) 50 MCG/ACT nasal spray Place 1-2 sprays into both nostrils daily. 04/20/21   Theadora Rama Scales, PA-C  levonorgestrel-ethinyl estradiol (SEASONALE) 0.15-0.03 MG tablet Take 1 tablet by mouth daily. 03/13/21   [provider]  lidocaine (LIDODERM) 5 % Place 1 patch onto the skin daily. Remove & Discard patch within 12 hours or as directed by MD 09/20/20   Charlynne Pander, MD  meloxicam (MOBIC) 15 MG tablet TAKE 1  TABLET BY MOUTH EVERY DAY 04/01/19   Asencion Islam, DPM  naproxen (NAPROSYN) 500 MG tablet Take 1 tablet (500 mg total) by mouth 2 (two) times daily. 04/05/21   Wieters, Hallie C, PA-C  olopatadine (PATANOL) 0.1 % ophthalmic solution Place 1 drop into both eyes 2 (two) times daily. 04/05/21   Wieters, Junius Creamer, PA-C    Family History History reviewed. No pertinent family history.  Social History Social History   Tobacco Use   Smoking status: Never   Smokeless tobacco: Never  Vaping Use   Vaping Use: Never used  Substance Use Topics   Alcohol use: Yes   Drug use: Never      Allergies   Fish allergy and Peanut oil   Review of Systems Review of Systems Pertinent findings noted in history of present illness.    Physical Exam Triage Vital Signs ED Triage Vitals  Enc Vitals Group     BP 04/20/21 1040 124/84     Pulse Rate 04/20/21 1040 (!) 109     Resp 04/20/21 1040 18     Temp 04/20/21 1040 98.3 F (36.8 C)     Temp Source 04/20/21 1040 Oral     SpO2 04/20/21 1040 96 %     Weight --      Height --      Head Circumference --      Peak Flow --      Pain Score 04/20/21 1038 7     Pain Loc --      Pain Edu? --      Excl. in GC? --    No data found.  Updated Vital Signs BP 124/84 (BP Location: Right Arm)   Pulse (!) 109   Temp 98.3 F (36.8 C) (Oral)   Resp 18   LMP 03/01/2021   SpO2 96%   Visual Acuity Right Eye Distance:   Left Eye Distance:   Bilateral Distance:    Right Eye Near:   Left Eye Near:    Bilateral Near:     Physical Exam Vitals and nursing note reviewed.  Constitutional:      Appearance: Normal appearance.  HENT:     Head: Normocephalic and atraumatic.     Right Ear: Ear canal and external ear normal. Tympanic membrane is bulging (Clear fluid, no hemotympanum).     Left Ear: Ear canal and external ear normal. Tympanic membrane is bulging (Clear fluid, no hemotympanum).     Nose: Nose normal.     Right Turbinates: Enlarged, swollen and pale.     Left Turbinates: Enlarged, swollen and pale.     Right Sinus: No maxillary sinus tenderness or frontal sinus tenderness.     Left Sinus: No maxillary sinus tenderness or frontal sinus tenderness.     Mouth/Throat:     Mouth: Mucous membranes are moist.     Pharynx: Oropharynx is clear. Uvula midline. No pharyngeal swelling, oropharyngeal exudate, posterior oropharyngeal erythema or uvula swelling.     Tonsils: No tonsillar exudate. 0 on the right. 0 on the left.  Eyes:     Extraocular Movements: Extraocular movements intact.     Conjunctiva/sclera: Conjunctivae  normal.     Pupils: Pupils are equal, round, and reactive to light.  Cardiovascular:     Rate and Rhythm: Normal rate and regular rhythm.     Pulses: Normal pulses.     Heart sounds: Normal heart sounds.  Pulmonary:     Effort: Pulmonary effort is normal.  Breath sounds: Examination of the right-middle field reveals decreased breath sounds. Examination of the left-middle field reveals decreased breath sounds. Examination of the right-lower field reveals decreased breath sounds. Examination of the left-lower field reveals decreased breath sounds. Decreased breath sounds present.  Musculoskeletal:        General: Normal range of motion.     Cervical back: Normal range of motion and neck supple.  Skin:    General: Skin is warm and dry.  Neurological:     General: No focal deficit present.     Mental Status: She is alert and oriented to person, place, and time. Mental status is at baseline.  Psychiatric:        Mood and Affect: Mood normal.        Behavior: Behavior normal.     UC Treatments / Results  Labs (all labs ordered are listed, but only abnormal results are displayed) Labs Reviewed - No data to display  EKG   Radiology No results found.  Procedures Procedures (including critical care time)  Medications Ordered in UC Medications  albuterol (VENTOLIN HFA) 108 (90 Base) MCG/ACT inhaler 6 puff (6 puffs Inhalation Given 04/20/21 1059)    Initial Impression / Assessment and Plan / UC Course  I have reviewed the triage vital signs and the nursing notes.  Pertinent labs & imaging results that were available during my care of the patient were reviewed by me and considered in my medical decision making (see chart for details).     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  I also believe the patient  has uncontrolled allergies which is possibly triggering some mild asthma symptoms.  Patient was provided with 6 puffs of albuterol during visit today with no meaningful improvement to her work of breathing however pulmonary exam was improved 10 minutes after albuterol was administered.  I have renewed her prescriptions for albuterol HFA as well as nebs.  Have also renewed her prescription for Allegra and mayonnaise.  I have encouraged her to reach out to her student health services to discuss accommodations and obtain a referral to a provider who can help her address her ADHD symptoms.  Patient verbalized understanding and agreement of plan as discussed.  All questions were addressed during visit.  Final Clinical Impressions(s) / UC Diagnoses   Final diagnoses:  Mild intermittent asthma, unspecified whether complicated  Panic disorder  Attention deficit hyperactivity disorder (ADHD), predominantly inattentive type  Allergic rhinitis, unspecified seasonality, unspecified trigger     Discharge Instructions      Resume Flonase and fexofenadine for your allergic rhinitis.  I have renewed your prescriptions for albuterol inhaler as well as nebulizer treatment.  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  Thank you for visiting urgent care today.     ED Prescriptions     Medication Sig Dispense Auth. Provider   albuterol (VENTOLIN HFA) 108 (90 Base) MCG/ACT inhaler Inhale 2 puffs into the lungs every 4 (four) hours as needed for wheezing or shortness of breath. 18 g Theadora Rama Scales, PA-C   fluticasone [facility]) 50 MCG/ACT nasal spray Place 1-2 sprays into both nostrils daily. 16 g Theadora Rama Scales, PA-C   albuterol (ACCUNEB) 1.25  MG/3ML nebulizer solution Take 3 mLs (1.25 mg total) by nebulization every 6 (six) hours as  needed for wheezing. 75 mL Theadora Rama Scales, PA-C   fexofenadine (ALLEGRA) 180 MG tablet Take 1 tablet (180 mg total) by mouth daily. 90 tablet Theadora Rama Scales, [place]      PDMP not reviewed this encounter.   Theadora Rama Scales, PA-C 04/20/21 1141

## 2021-05-05 ENCOUNTER — Other Ambulatory Visit: Payer: Self-pay

## 2021-05-05 ENCOUNTER — Ambulatory Visit
Admission: EM | Admit: 2021-05-05 | Discharge: 2021-05-05 | Disposition: A | Discharge status: Home / Self Care | Payer: 59 | Attending: Emergency Medicine | Admitting: Emergency Medicine

## 2021-05-05 DIAGNOSIS — Z20822 Contact with and (suspected) exposure to covid-19: Secondary | ICD-10-CM

## 2021-05-05 DIAGNOSIS — R0602 Shortness of breath: Secondary | ICD-10-CM | POA: Diagnosis not present

## 2021-05-05 NOTE — ED Triage Notes (Signed)
Patient states two days ago she had an exposure to Covid. She took an at home covid test that was negative today. She reports having fatigue and SOB with activity. She also reports having diarrhea and headache, and congestion. Patient does not display respiratory distress at this time.

## 2021-05-05 NOTE — Discharge Instructions (Addendum)
Please continue to monitor your symptoms.  I have provided you a note to return back to school on Friday.  If you do not feel better on Friday, I recommend that you come back here to be retested instead of 1 back to school. You can try Tylenol or ibuprofen for your muscle aches and joint pain.

## 2021-05-05 NOTE — ED Provider Notes (Signed)
[facility]    CSN: [id number] Arrival date & time: 05/05/21  1112      History   Chief Complaint Chief Complaint  Patient presents with   Shortness of Breath   Fatigue    HPI Julie Hudson is a 25 y.o. female.   Patient states two days ago she had a possible exposure to COVID 19, states one of her classmates left class early and another classmate told everyone that the classmate that just left had COVID-19.  States she took an at home covid test today which was negative.  Patient complains of severe fatigue, myalgia, arthralgia and SOB with activity. She also reports having a single episode of diarrhea, headache, and congestion.  Vital signs are normal during visit today, patient appears to be in no acute distress.  The history is provided by the patient.   Past Medical History:  Diagnosis Date   ADHD     There are no problems to display for this patient.   History reviewed. No pertinent surgical history.  OB History   No obstetric history on file.      Home Medications    Prior to Admission medications   Medication Sig Start Date End Date Taking? Authorizing Provider  albuterol (ACCUNEB) 1.25 MG/3ML nebulizer solution Take 3 mLs (1.25 mg total) by nebulization every 6 (six) hours as needed for wheezing. 04/20/21   Theadora Rama Scales, PA-C  albuterol (VENTOLIN HFA) 108 (90 Base) MCG/ACT inhaler Inhale 2 puffs into the lungs every 4 (four) hours as needed for wheezing or shortness of breath. 04/20/21   Theadora Rama Scales, PA-C  amphetamine-dextroamphetamine (ADDERALL) 20 MG tablet Take by mouth.    [provider]  fexofenadine (ALLEGRA) 180 MG tablet Take 1 tablet (180 mg total) by mouth daily. 04/20/21 07/19/21  Theadora Rama Scales, PA-C  fluticasone (FLONASE) 50 MCG/ACT nasal spray Place 1-2 sprays into both nostrils daily. 04/20/21   Theadora Rama Scales, PA-C  levonorgestrel-ethinyl estradiol (SEASONALE) 0.15-0.03 MG tablet Take 1 tablet  by mouth daily. 03/13/21   [provider]  lidocaine (LIDODERM) 5 % Place 1 patch onto the skin daily. Remove & Discard patch within 12 hours or as directed by MD 09/20/20   Charlynne Pander, MD  meloxicam (MOBIC) 15 MG tablet TAKE 1 TABLET BY MOUTH EVERY DAY 04/01/19   Asencion Islam, DPM  naproxen (NAPROSYN) 500 MG tablet Take 1 tablet (500 mg total) by mouth 2 (two) times daily. 04/05/21   Wieters, Hallie C, PA-C  olopatadine (PATANOL) 0.1 % ophthalmic solution Place 1 drop into both eyes 2 (two) times daily. 04/05/21   Wieters, Junius Creamer, PA-C    Family History History reviewed. No pertinent family history.  Social History Social History   Tobacco Use   Smoking status: Never   Smokeless tobacco: Never  Vaping Use   Vaping Use: Never used  Substance Use Topics   Alcohol use: Yes   Drug use: Never     Allergies   Fish allergy and Peanut oil   Review of Systems Review of Systems Pertinent findings noted in history of present illness.    Physical Exam Triage Vital Signs ED Triage Vitals  Enc Vitals Group     BP 05/05/21 1157 123/83     Pulse Rate 05/05/21 1157 99     Resp 05/05/21 1157 18     Temp 05/05/21 1157 98.3 F (36.8 C)     Temp Source 05/05/21 1157 Oral  SpO2 05/05/21 1157 95 %     Weight 05/05/21 1157 195 lb (88.5 kg)     Height --      Head Circumference --      Peak Flow --      Pain Score 05/05/21 1156 8     Pain Loc --      Pain Edu? --      Excl. in GC? --    No data found.  Updated Vital Signs BP 123/83 (BP Location: Right Arm)   Pulse 99   Temp 98.3 F (36.8 C) (Oral)   Resp 18   Wt 195 lb (88.5 kg)   LMP 03/03/2021 (Approximate)   SpO2 95%   BMI 35.67 kg/m   Visual Acuity Right Eye Distance:   Left Eye Distance:   Bilateral Distance:    Right Eye Near:   Left Eye Near:    Bilateral Near:     Physical Exam Vitals and nursing note reviewed.  Constitutional:      Appearance: Normal appearance.  HENT:     Head:  Normocephalic and atraumatic.     Right Ear: Tympanic membrane, ear canal and external ear normal.     Left Ear: Tympanic membrane, ear canal and external ear normal.     Nose: Nose normal.     Mouth/Throat:     Mouth: Mucous membranes are moist.     Pharynx: Oropharynx is clear.  Eyes:     Extraocular Movements: Extraocular movements intact.     Conjunctiva/sclera: Conjunctivae normal.     Pupils: Pupils are equal, round, and reactive to light.  Cardiovascular:     Rate and Rhythm: Normal rate and regular rhythm.     Pulses: Normal pulses.     Heart sounds: Normal heart sounds.  Pulmonary:     Effort: Pulmonary effort is normal.     Breath sounds: Normal breath sounds. No decreased breath sounds, wheezing, rhonchi or rales.  Abdominal:     General: Abdomen is flat. Bowel sounds are normal.     Palpations: Abdomen is soft.  Musculoskeletal:        General: Normal range of motion.     Cervical back: Normal range of motion and neck supple.  Skin:    General: Skin is warm and dry.  Neurological:     General: No focal deficit present.     Mental Status: She is alert and oriented to person, place, and time. Mental status is at baseline.  Psychiatric:        Mood and Affect: Mood is anxious.        Behavior: Behavior normal.     UC Treatments / Results  Labs (all labs ordered are listed, but only abnormal results are displayed) Labs Reviewed  COVID-19, FLU A+B NAA    EKG   Radiology No results found.  Procedures Procedures (including critical care time)  Medications Ordered in UC Medications - No data to display  Initial Impression / Assessment and Plan / UC Course  I have reviewed the triage vital signs and the nursing notes.  Pertinent labs & imaging results that were available during my care of the patient were reviewed by me and considered in my medical decision making (see chart for details).     Anxiety about health, subjective third-party report of  possible exposure to COVID-19.  Patient was swabbed on arrival for COVID-19 and influenza, rapid flu test was negative.  COVID-19 test is pending.  During my visit  with patient, she declined testing however as the orders had already been placed in process we will proceed.  Provided with a note for school, advised to return in 2 days for repeat testing if she is not feeling better.  Patient verbalized understanding and agreement of plan as discussed.  All questions were addressed during visit.  Please see discharge instructions below for further details of plan.  Final Clinical Impressions(s) / UC Diagnoses   Final diagnoses:  Shortness of breath  Contact with and (suspected) exposure to covid-19     Discharge Instructions      Please continue to monitor your symptoms.  I have provided you a note to return back to school on Friday.  If you do not feel better on Friday, I recommend that you come back here to be retested instead of 1 back to school. You can try Tylenol or ibuprofen for your muscle aches and joint pain.     ED Prescriptions   None    PDMP not reviewed this encounter.   Theadora Rama Scales, PA-C 05/05/21 1302

## 2021-05-07 LAB — COVID-19, FLU A+B NAA
Influenza A, NAA: NOT DETECTED
Influenza B, NAA: NOT DETECTED
SARS-CoV-2, NAA: NOT DETECTED

## 2021-08-23 ENCOUNTER — Ambulatory Visit
Admission: EM | Admit: 2021-08-23 | Discharge: 2021-08-23 | Disposition: A | Discharge status: Home / Self Care | Payer: 59 | Attending: Emergency Medicine | Admitting: Emergency Medicine

## 2021-08-23 ENCOUNTER — Other Ambulatory Visit: Payer: Self-pay

## 2021-08-23 DIAGNOSIS — J4520 Mild intermittent asthma, uncomplicated: Secondary | ICD-10-CM | POA: Diagnosis present

## 2021-08-23 DIAGNOSIS — G43009 Migraine without aura, not intractable, without status migrainosus: Secondary | ICD-10-CM | POA: Diagnosis present

## 2021-08-23 DIAGNOSIS — R109 Unspecified abdominal pain: Secondary | ICD-10-CM | POA: Diagnosis present

## 2021-08-23 LAB — POCT URINALYSIS DIP (MANUAL ENTRY)
Bilirubin, UA: NEGATIVE
Glucose, UA: NEGATIVE mg/dL
Ketones, POC UA: NEGATIVE mg/dL
Leukocytes, UA: NEGATIVE
Nitrite, UA: NEGATIVE
Protein Ur, POC: NEGATIVE mg/dL
Spec Grav, UA: 1.02 (ref 1.010–1.025)
Urobilinogen, UA: 0.2 E.U./dL
pH, UA: 6 (ref 5.0–8.0)

## 2021-08-23 LAB — POCT URINE PREGNANCY: Preg Test, Ur: NEGATIVE

## 2021-08-23 MED ORDER — ALBUTEROL SULFATE 1.25 MG/3ML IN NEBU: 1.0000 | INHALATION_SOLUTION | Freq: Four times a day (QID) | RESPIRATORY_TRACT | 0 refills | Status: AC | PRN

## 2021-08-23 NOTE — Discharge Instructions (Addendum)
I have renewed your albuterol nebulizer treatment.  Please use this twice daily for the next few days.  Please discuss your abnormal vaginal bleeding with your gynecologist.  Please continue Excedrin for headache.  Urine culture will be performed, if there were any abnormal findings he will be provided with a prescription for antibiotics to treat for urinary tract infection.  Thank you for visiting urgent care today.

## 2021-08-23 NOTE — ED Provider Notes (Signed)
[facility]    CSN: [id number] Arrival date & time: 08/23/21  0945    HISTORY   Chief Complaint  Patient presents with   Abdominal Pain   HPI Julie Hudson is a 26 y.o. female. Pt reports having a migraine Friday and abd pain, states she took Excedrin and ibuprofen with some relief.  States the migraine came back.  States she does not have abdominal pain any longer.  Pt states she is on a 3 month pack of birth control and she has been bleeding for the past two weeks, states she is at the end of the 55-month cycle, this is her third round of this 50-month birth control method, patient states she has failed birth control patches and single months birth control pills, states she is under the care of a gynecologist in [place] who has advised her that if she does not like the 55-month cycle of birth control pills she can begin IUD. Pt states that bleeding is light, does not require a pad.  She also reports feeling SOB yesterday and utilized her albuterol inhaler which did help, patient denies nasal congestion, cough, body aches, chills, fever.  Vital signs are normal on arrival today.  Patient is well-appearing.  The history is provided by the patient.  Past Medical History:  Diagnosis Date   ADHD    There are no problems to display for this patient.  History reviewed. No pertinent surgical history. OB History   No obstetric history on file.    Home Medications    Prior to Admission medications   Medication Sig Start Date End Date Taking? Authorizing Provider  albuterol (ACCUNEB) 1.25 MG/3ML nebulizer solution Take 3 mLs (1.25 mg total) by nebulization every 6 (six) hours as needed for wheezing. 04/20/21   Theadora Rama Scales, PA-C  albuterol (VENTOLIN HFA) 108 (90 Base) MCG/ACT inhaler Inhale 2 puffs into the lungs every 4 (four) hours as needed for wheezing or shortness of breath. 04/20/21   Theadora Rama Scales, PA-C  amphetamine-dextroamphetamine (ADDERALL) 20 MG tablet  Take by mouth.    [provider]  fexofenadine (ALLEGRA) 180 MG tablet Take 1 tablet (180 mg total) by mouth daily. 04/20/21 07/19/21  Theadora Rama Scales, PA-C  fluticasone (FLONASE) 50 MCG/ACT nasal spray Place 1-2 sprays into both nostrils daily. 04/20/21   Theadora Rama Scales, PA-C  levonorgestrel-ethinyl estradiol (SEASONALE) 0.15-0.03 MG tablet Take 1 tablet by mouth daily. 03/13/21   [provider]  lidocaine (LIDODERM) 5 % Place 1 patch onto the skin daily. Remove & Discard patch within 12 hours or as directed by MD 09/20/20   Charlynne Pander, MD  meloxicam (MOBIC) 15 MG tablet TAKE 1 TABLET BY MOUTH EVERY DAY 04/01/19   Asencion Islam, DPM  naproxen (NAPROSYN) 500 MG tablet Take 1 tablet (500 mg total) by mouth 2 (two) times daily. 04/05/21   Wieters, Hallie C, PA-C  olopatadine (PATANOL) 0.1 % ophthalmic solution Place 1 drop into both eyes 2 (two) times daily. 04/05/21   Wieters, Junius Creamer, PA-C    Family History History reviewed. No pertinent family history. Social History Social History   Tobacco Use   Smoking status: Never   Smokeless tobacco: Never  Vaping Use   Vaping Use: Never used  Substance Use Topics   Alcohol use: Yes   Drug use: Never   Allergies   Fish allergy and Peanut oil  Review of Systems Review of Systems Pertinent findings noted in history of present illness.  Physical Exam Triage Vital Signs ED Triage Vitals  Enc Vitals Group     BP 05/14/21 0827 (!) 147/82     Pulse Rate 05/14/21 0827 72     Resp 05/14/21 0827 18     Temp 05/14/21 0827 98.3 F (36.8 C)     Temp Source 05/14/21 0827 Oral     SpO2 05/14/21 0827 98 %     Weight --      Height --      Head Circumference --      Peak Flow --      Pain Score 05/14/21 0826 5     Pain Loc --      Pain Edu? --      Excl. in GC? --   No data found.  Updated Vital Signs BP 111/75 (BP Location: Left Arm)    Pulse 99    Temp 98.2 F (36.8 C) (Oral)    Resp 18    LMP  06/20/2021    SpO2 96%   Physical Exam Vitals and nursing note reviewed.  Constitutional:      General: She is not in acute distress.    Appearance: Normal appearance. She is not ill-appearing.  HENT:     Head: Normocephalic and atraumatic.     Salivary Glands: Right salivary gland is not diffusely enlarged or tender. Left salivary gland is not diffusely enlarged or tender.     Right Ear: Tympanic membrane, ear canal and external ear normal. No drainage. No middle ear effusion. There is no impacted cerumen. Tympanic membrane is not erythematous or bulging.     Left Ear: Tympanic membrane, ear canal and external ear normal. No drainage.  No middle ear effusion. There is no impacted cerumen. Tympanic membrane is not erythematous or bulging.     Nose: Nose normal. No nasal deformity, septal deviation, mucosal edema, congestion or rhinorrhea.     Right Turbinates: Not enlarged, swollen or pale.     Left Turbinates: Not enlarged, swollen or pale.     Right Sinus: No maxillary sinus tenderness or frontal sinus tenderness.     Left Sinus: No maxillary sinus tenderness or frontal sinus tenderness.     Mouth/Throat:     Lips: Pink. No lesions.     Mouth: Mucous membranes are moist. No oral lesions.     Pharynx: Oropharynx is clear. Uvula midline. No posterior oropharyngeal erythema or uvula swelling.     Tonsils: No tonsillar exudate. 0 on the right. 0 on the left.  Eyes:     General: Lids are normal.        Right eye: No discharge.        Left eye: No discharge.     Extraocular Movements: Extraocular movements intact.     Conjunctiva/sclera: Conjunctivae normal.     Right eye: Right conjunctiva is not injected.     Left eye: Left conjunctiva is not injected.  Neck:     Trachea: Trachea and phonation normal.  Cardiovascular:     Rate and Rhythm: Normal rate and regular rhythm.     Pulses: Normal pulses.     Heart sounds: Normal heart sounds. No murmur heard.   No friction rub. No gallop.   Pulmonary:     Effort: Pulmonary effort is normal. No accessory muscle usage, prolonged expiration or respiratory distress.     Breath sounds: Normal breath sounds. No stridor, decreased air movement or transmitted upper airway sounds. No decreased breath sounds, wheezing, rhonchi or  rales.  Chest:     Chest wall: No tenderness.  Abdominal:     General: Abdomen is flat. Bowel sounds are normal. There is no distension.     Palpations: Abdomen is soft.     Tenderness: There is no abdominal tenderness. There is no right CVA tenderness or left CVA tenderness.     Hernia: No hernia is present.  Musculoskeletal:        General: Normal range of motion.     Cervical back: Normal range of motion and neck supple. Normal range of motion.  Lymphadenopathy:     Cervical: No cervical adenopathy.  Skin:    General: Skin is warm and dry.     Findings: No erythema or rash.  Neurological:     General: No focal deficit present.     Mental Status: She is alert and oriented to person, place, and time.  Psychiatric:        Mood and Affect: Mood normal.        Behavior: Behavior normal.    Visual Acuity Right Eye Distance:   Left Eye Distance:   Bilateral Distance:    Right Eye Near:   Left Eye Near:    Bilateral Near:     UC Couse / Diagnostics / Procedures:    EKG  Radiology No results found.  Procedures Procedures (including critical care time)  UC Diagnoses / Final Clinical Impressions(s)   I have reviewed the triage vital signs and the nursing notes.  Pertinent labs & imaging results that were available during my care of the patient were reviewed by me and considered in my medical decision making (see chart for details).    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  We will perform urine  culture and treat for UTI as needed based on urine culture result.  Patient advised to continue Excedrin.  Patient advised to follow-up with gynecologist regarding intermenstrual bleeding.  ED Prescriptions     Medication Sig Dispense Auth. Provider   albuterol (ACCUNEB) 1.25 MG/3ML nebulizer solution Take 3 mLs (1.25 mg total) by nebulization every 6 (six) hours as needed for up to 60 doses for wheezing. 180 mL Theadora RamaMorgan, Chayne Baumgart Scales, PA-C      PDMP not reviewed this encounter.  Pending results:  Labs Reviewed  POCT URINALYSIS DIP (MANUAL ENTRY) - Abnormal; Notable for the following components:      Result Value   Clarity, UA hazy (*)    Blood, UA large (*)    All other components within normal limits  POCT URINE PREGNANCY    Medications Ordered in UC: Medications - No data to display  Disposition Upon Discharge:  Condition: stable for discharge home Home: take medications as prescribed; routine discharge instructions as discussed; follow up as advised.  Patient presented with an acute illness with associated systemic symptoms and significant discomfort requiring urgent management. In my opinion, this is a condition that a prudent lay person (someone who possesses an average knowledge of health and medicine) may potentially expect to result in complications if not addressed urgently such as respiratory distress, impairment of bodily function or dysfunction of bodily organs.   Routine symptom specific, illness specific and/or disease specific instructions were discussed with the patient and/or caregiver at length.   As such, the patient has been evaluated and assessed, work-up was performed  and treatment was provided in alignment with urgent care protocols and evidence based medicine.  Patient/parent/caregiver has been advised that the patient may require follow up for further testing and treatment if the symptoms continue in spite of treatment, as clinically indicated and  appropriate.  If the patient was tested for COVID-19, Influenza and/or RSV, then the patient/parent/guardian was advised to isolate at home pending the results of his/her diagnostic coronavirus test and potentially longer if theyre positive. I have also advised pt that if his/her COVID-19 test returns positive, it's recommended to self-isolate for at least 10 days after symptoms first appeared AND until fever-free for 24 hours without fever reducer AND other symptoms have improved or resolved. Discussed self-isolation recommendations as well as instructions for household member/close contacts as per the [facility] and [place] DHHS, and also gave patient the COVID packet with this information.  Patient/parent/caregiver has been advised to return to the [facility] or PCP in 3-5 days if no better; to PCP or the Emergency Department if new signs and symptoms develop, or if the current signs or symptoms continue to change or worsen for further workup, evaluation and treatment as clinically indicated and appropriate  The patient will follow up with their current PCP if and as advised. If the patient does not currently have a PCP we will assist them in obtaining one.   The patient may need specialty follow up if the symptoms continue, in spite of conservative treatment and management, for further workup, evaluation, consultation and treatment as clinically indicated and appropriate.   Patient/parent/caregiver verbalized understanding and agreement of plan as discussed.  All questions were addressed during visit.  Please see discharge instructions below for further details of plan.  Discharge Instructions:   Discharge Instructions      I have renewed your albuterol nebulizer treatment.  Please use this twice daily for the next few days.  Please discuss your abnormal vaginal bleeding with your gynecologist.  Please continue Excedrin for headache.  Urine culture will be performed, if there were any abnormal findings he will  be provided with a prescription for antibiotics to treat for urinary tract infection.  Thank you for visiting urgent care today.    This office note has been dictated using Teaching laboratory technician.  Unfortunately, and despite my best efforts, this method of dictation can sometimes lead to occasional typographical or grammatical errors.  I apologize in advance if this occurs.     Theadora Rama Scales, PA-C 08/23/21 1058

## 2021-08-23 NOTE — ED Triage Notes (Addendum)
Pt reports having a migraine Friday and abd pain.   Pt states she is on a 3 month pack of birth control and she has been bleeding for the past two weeks. Pt states that bleeding is light.  She reports feeling SOB yesterday and utilized her albuterol inhaler.

## 2021-08-24 LAB — URINE CULTURE

## 2021-12-08 ENCOUNTER — Encounter: Payer: Self-pay | Admitting: Emergency Medicine

## 2021-12-08 ENCOUNTER — Ambulatory Visit
Admission: EM | Admit: 2021-12-08 | Discharge: 2021-12-08 | Disposition: A | Discharge status: Home / Self Care | Payer: 59 | Attending: Emergency Medicine | Admitting: Emergency Medicine

## 2021-12-08 DIAGNOSIS — K029 Dental caries, unspecified: Secondary | ICD-10-CM | POA: Diagnosis not present

## 2021-12-08 DIAGNOSIS — K047 Periapical abscess without sinus: Secondary | ICD-10-CM | POA: Diagnosis not present

## 2021-12-08 MED ORDER — FLUCONAZOLE 150 MG PO TABS: ORAL_TABLET | ORAL | 0 refills | Status: DC

## 2021-12-08 MED ORDER — PENICILLIN V POTASSIUM 500 MG PO TABS: 500.0000 mg | ORAL_TABLET | Freq: Three times a day (TID) | ORAL | 0 refills | Status: AC

## 2021-12-08 NOTE — Discharge Instructions (Signed)
Please begin penicillin 1 tablet 3 times daily for the next 14 days to completely eradicate the bacterial infection around your left bottom wisdom tooth.  Dental infections can result to serious health conditions including infected heart valves which require surgical replacement.  Please take this infection very seriously.  Because the penicillin is likely going to give you a vaginal yeast infection which would be no fun on your vacation, I have also provided with a prescription for Diflucan.  There are 3 tablets, please take 1 tablet every 3 days.  I recommend that you do not begin taking this medication until the fourth or fifth day of taking the penicillin.  Please reach out to a dentist to have an appointment scheduled a few days after you return from your trip.  Even though the antibiotics will clear up the infection, they will not resolve the issue that resulted in the infection occurring.  Like you are already aware, it is most likely that you need to have that tooth removed.  The dentist that I mentioned is Raphael Gibney. Minor, DDS.  He is on [address] here in [place], his phone number is [phone].  Hopefully he can work you in.  Thank you for visiting urgent care today.  Have a great trip and good luck in school.

## 2021-12-08 NOTE — ED Triage Notes (Signed)
Patient presents to [facility] for a few days of soreness to her left gum line, near the back of the left teeth.  C/o some very mild swelling, mostly tjust sensitive and irritated.  Is leaving the country in a week and wants to be in good shape.

## 2021-12-08 NOTE — ED Provider Notes (Signed)
[facility]    CSN: [id number] Arrival date & time: 12/08/21  1303    HISTORY   Chief Complaint  Patient presents with   Mouth Injury    Entered by patient   Dental Pain   HPI Julie Hudson is a 26 y.o. female. Patient presents to [facility] for a few days of soreness to her left gum line, near the back of the left teeth.  C/o some very mild swelling, mostly just sensitivity and irritation.  Patient states she is leaving the country this week and wants to be treated.    The history is provided by the patient.  Past Medical History:  Diagnosis Date   ADHD    There are no problems to display for this patient.  History reviewed. No pertinent surgical history. OB History   No obstetric history on file.    Home Medications    Prior to Admission medications   Medication Sig Start Date End Date Taking? Authorizing Provider  albuterol (ACCUNEB) 1.25 MG/3ML nebulizer solution Take 3 mLs (1.25 mg total) by nebulization every 6 (six) hours as needed for up to 60 doses for wheezing. 08/23/21   Theadora Rama Scales, PA-C  albuterol (VENTOLIN HFA) 108 (90 Base) MCG/ACT inhaler Inhale 2 puffs into the lungs every 4 (four) hours as needed for wheezing or shortness of breath. 04/20/21   Theadora Rama Scales, PA-C  fexofenadine (ALLEGRA) 180 MG tablet Take 1 tablet (180 mg total) by mouth daily. 04/20/21 07/19/21  Theadora Rama Scales, PA-C  fluticasone (FLONASE) 50 MCG/ACT nasal spray Place 1-2 sprays into both nostrils daily. 04/20/21   Theadora Rama Scales, PA-C  levonorgestrel-ethinyl estradiol (SEASONALE) 0.15-0.03 MG tablet Take 1 tablet by mouth daily. 03/13/21   [provider]  lidocaine (LIDODERM) 5 % Place 1 patch onto the skin daily. Remove & Discard patch within 12 hours or as directed by MD 09/20/20   Charlynne Pander, MD  meloxicam (MOBIC) 15 MG tablet TAKE 1 TABLET BY MOUTH EVERY DAY 04/01/19   Asencion Islam, DPM  naproxen (NAPROSYN) 500 MG tablet Take 1 tablet  (500 mg total) by mouth 2 (two) times daily. 04/05/21   Wieters, Hallie C, PA-C  olopatadine (PATANOL) 0.1 % ophthalmic solution Place 1 drop into both eyes 2 (two) times daily. 04/05/21   Wieters, Junius Creamer, PA-C    Family History Family History  Problem Relation Age of Onset   Breast cancer Mother    Diabetes Father    Social History Social History   Tobacco Use   Smoking status: Never   Smokeless tobacco: Never  Vaping Use   Vaping Use: Never used  Substance Use Topics   Alcohol use: Yes   Drug use: Never   Allergies   Fish allergy and Peanut oil  Review of Systems Review of Systems Pertinent findings noted in history of present illness.   Physical Exam Triage Vital Signs ED Triage Vitals  Enc Vitals Group     BP 05/14/21 0827 (!) 147/82     Pulse Rate 05/14/21 0827 72     Resp 05/14/21 0827 18     Temp 05/14/21 0827 98.3 F (36.8 C)     Temp Source 05/14/21 0827 Oral     SpO2 05/14/21 0827 98 %     Weight --      Height --      Head Circumference --      Peak Flow --      Pain Score 05/14/21 0826  5     Pain Loc --      Pain Edu? --      Excl. in GC? --   No data found.  Updated Vital Signs BP 108/66 (BP Location: Right Arm)   Pulse 89   Temp 98.3 F (36.8 C) (Oral)   Resp 18   LMP 09/15/2021   SpO2 96%   Physical Exam Vitals and nursing note reviewed.  Constitutional:      General: She is not in acute distress.    Appearance: Normal appearance. She is not ill-appearing.  HENT:     Head: Normocephalic and atraumatic.     Salivary Glands: Right salivary gland is not diffusely enlarged or tender. Left salivary gland is not diffusely enlarged or tender.     Right Ear: Tympanic membrane, ear canal and external ear normal. No drainage. No middle ear effusion. There is no impacted cerumen. Tympanic membrane is not erythematous or bulging.     Left Ear: Tympanic membrane, ear canal and external ear normal. No drainage.  No middle ear effusion. There is no  impacted cerumen. Tympanic membrane is not erythematous or bulging.     Nose: Nose normal. No nasal deformity, septal deviation, mucosal edema, congestion or rhinorrhea.     Right Turbinates: Not enlarged, swollen or pale.     Left Turbinates: Not enlarged, swollen or pale.     Right Sinus: No maxillary sinus tenderness or frontal sinus tenderness.     Left Sinus: No maxillary sinus tenderness or frontal sinus tenderness.     Mouth/Throat:     Lips: Pink. No lesions.     Mouth: Mucous membranes are moist. No oral lesions.     Dentition: Gingival swelling, dental caries, dental abscesses and gum lesions present.     Tongue: No lesions.     Palate: No mass.     Pharynx: Oropharynx is clear. Uvula midline. No posterior oropharyngeal erythema or uvula swelling.     Tonsils: No tonsillar exudate. 0 on the right. 0 on the left.   Eyes:     General: Lids are normal.        Right eye: No discharge.        Left eye: No discharge.     Extraocular Movements: Extraocular movements intact.     Conjunctiva/sclera: Conjunctivae normal.     Right eye: Right conjunctiva is not injected.     Left eye: Left conjunctiva is not injected.  Neck:     Trachea: Trachea and phonation normal.  Cardiovascular:     Rate and Rhythm: Normal rate and regular rhythm.     Pulses: Normal pulses.     Heart sounds: Normal heart sounds. No murmur heard.   No friction rub. No gallop.  Pulmonary:     Effort: Pulmonary effort is normal. No accessory muscle usage, prolonged expiration or respiratory distress.     Breath sounds: Normal breath sounds. No stridor, decreased air movement or transmitted upper airway sounds. No decreased breath sounds, wheezing, rhonchi or rales.  Chest:     Chest wall: No tenderness.  Musculoskeletal:        General: Normal range of motion.     Cervical back: Normal range of motion and neck supple. Normal range of motion.  Lymphadenopathy:     Cervical: No cervical adenopathy.  Skin:     General: Skin is warm and dry.     Findings: No erythema or rash.  Neurological:     General: No  focal deficit present.     Mental Status: She is alert and oriented to person, place, and time.  Psychiatric:        Mood and Affect: Mood normal.        Behavior: Behavior normal.    Visual Acuity Right Eye Distance:   Left Eye Distance:   Bilateral Distance:    Right Eye Near:   Left Eye Near:    Bilateral Near:     UC Couse / Diagnostics / Procedures:    EKG  Radiology No results found.  Procedures Procedures (including critical care time)  UC Diagnoses / Final Clinical Impressions(s)   I have reviewed the triage vital signs and the nursing notes.  Pertinent labs & imaging results that were available during my care of the patient were reviewed by me and considered in my medical decision making (see chart for details).    Final diagnoses:  Dental abscess  Dental caries noted on examination   Patient provided with a prescription for penicillin for 14 days along with a prescription for Diflucan to treat the inevitable vaginal yeast infection after taking penicillin for 14 days.  Patient provided with a name of a dentist she can reach out to to schedule an appointment to have the tooth addressed.  Return precautions advised.  ED Prescriptions     Medication Sig Dispense Auth. Provider   penicillin v potassium (VEETID) 500 MG tablet Take 1 tablet (500 mg total) by mouth 3 (three) times daily for 14 days. 42 tablet Theadora RamaMorgan, Tykeisha Peer Scales, PA-C   fluconazole (DIFLUCAN) 150 MG tablet Take first tablet on day 1.  Take second tablet 3 days after first tablet.  Take third tablet 3 days after second tablet. 3 tablet Theadora RamaMorgan, Shamica Moree Scales, PA-C      PDMP not reviewed this encounter.  Pending results:  Labs Reviewed - No data to display  Medications Ordered in UC: Medications - No data to display  Disposition Upon Discharge:  Condition: stable for discharge home Home: take  medications as prescribed; routine discharge instructions as discussed; follow up as advised.  Patient presented with an acute illness with associated systemic symptoms and significant discomfort requiring urgent management. In my opinion, this is a condition that a prudent lay person (someone who possesses an average knowledge of health and medicine) may potentially expect to result in complications if not addressed urgently such as respiratory distress, impairment of bodily function or dysfunction of bodily organs.   Routine symptom specific, illness specific and/or disease specific instructions were discussed with the patient and/or caregiver at length.   As such, the patient has been evaluated and assessed, work-up was performed and treatment was provided in alignment with urgent care protocols and evidence based medicine.  Patient/parent/caregiver has been advised that the patient may require follow up for further testing and treatment if the symptoms continue in spite of treatment, as clinically indicated and appropriate.  Patient/parent/caregiver [facility] been [facility] if no better; to PCP or the Emergency Department if new signs and symptoms develop, or if the current signs or symptoms continue to change or worsen for further workup, evaluation and treatment as clinically indicated and appropriate  The patient will follow up with their current PCP if and as advised. If the patient does not currently have a PCP we will assist them in obtaining one.   The patient may need specialty follow up if the symptoms continue, in spite of conservative treatment and  management, for further workup, evaluation, consultation and treatment as clinically indicated and appropriate.   Patient/parent/caregiver verbalized understanding and agreement of plan as discussed.  All questions were addressed during visit.  Please see discharge instructions below for further details of  plan.  Discharge Instructions:   Discharge Instructions      Please begin penicillin 1 tablet 3 times daily for the next 14 days to completely eradicate the bacterial infection around your left bottom wisdom tooth.  Dental infections can result to serious health conditions including infected heart valves which require surgical replacement.  Please take this infection very seriously.  Because the penicillin is likely going to give you a vaginal yeast infection which would be no fun on your vacation, I have also provided with a prescription for Diflucan.  There are 3 tablets, please take 1 tablet every 3 days.  I recommend that you do not begin taking this medication until the fourth or fifth day of taking the penicillin.  Please reach out to a dentist to have an appointment scheduled a few days after you return from your trip.  Even though the antibiotics will clear up the infection, they will not resolve the issue that resulted in the infection occurring.  Like you are already aware, it is most likely that you need to have that tooth removed.  The dentist that I mentioned is Raphael Gibney. Minor, DDS.  He is on [address] here in [place], his phone number is [phone].  Hopefully he can work you in.  Thank you for visiting urgent care today.  Have a great trip and good luck in school.      This office note has been dictated using Teaching laboratory technician.  Unfortunately, and despite my best efforts, this method of dictation can sometimes lead to occasional typographical or grammatical errors.  I apologize in advance if this occurs.     Theadora Rama Scales, PA-C 12/08/21 1525

## 2022-02-08 IMAGING — DX DG CHEST 2V
2 series · 2 of 2 positions shown · non-contrast
Comparison: None.

CLINICAL DATA: Chest pain and shortness of breath.

EXAM:
CHEST - 2 VIEW

[chest pa]
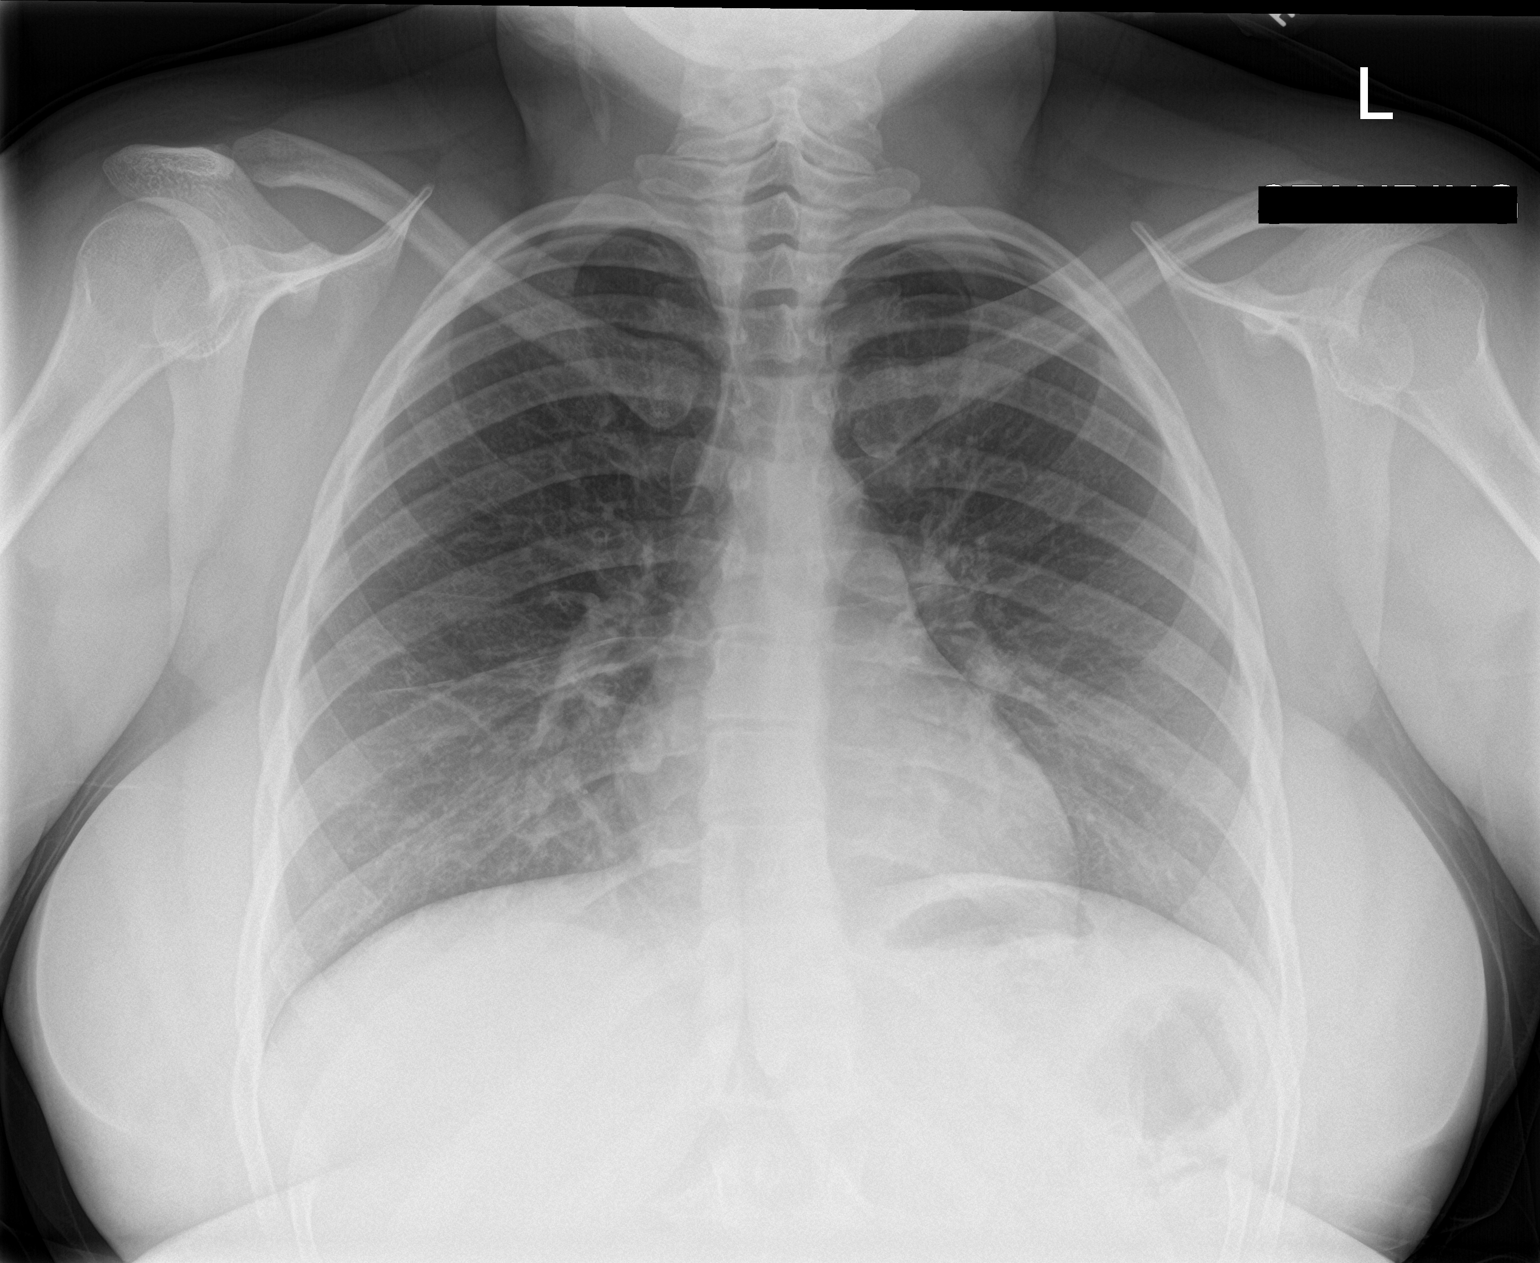

[chest lat]
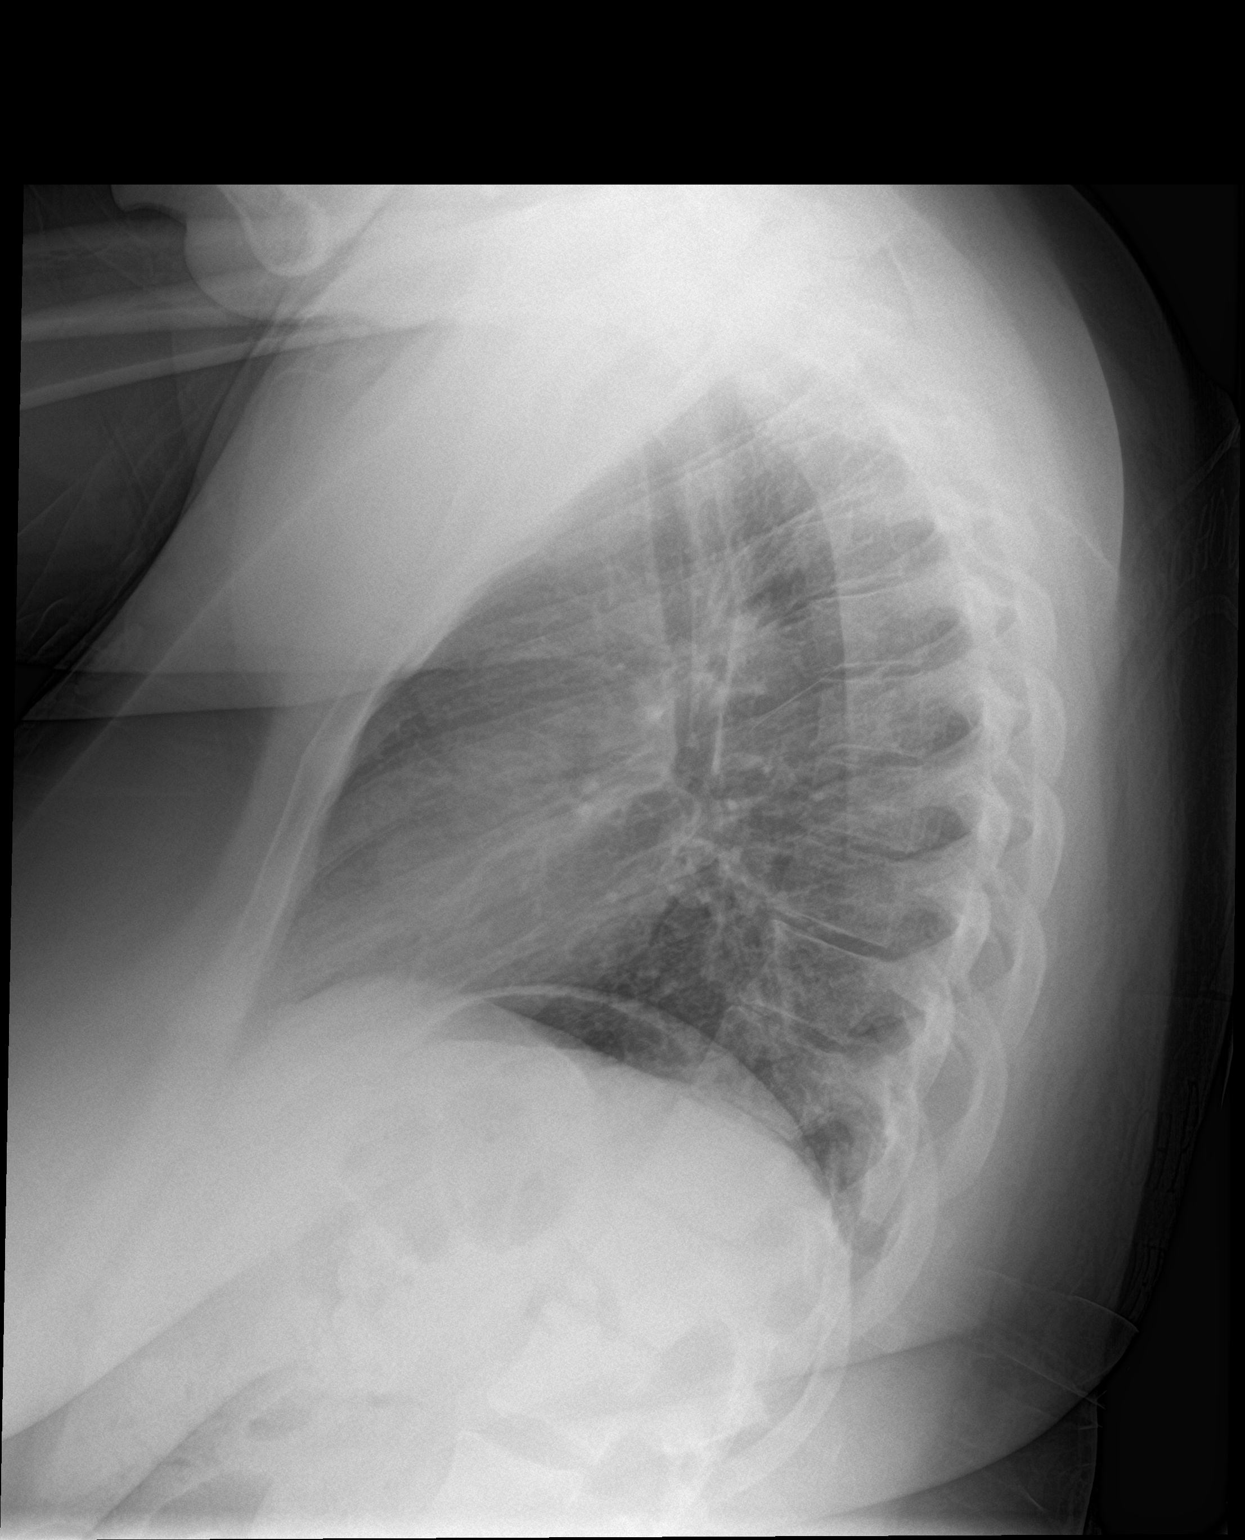

[2 of 2 positions shown; findings below may reference images not displayed]

FINDINGS: The cardiomediastinal contours are normal. The lungs are clear.
Pulmonary vasculature is normal. No consolidation, pleural effusion,
or pneumothorax. No acute osseous abnormalities are seen.
IMPRESSION: Negative radiographs of the chest.

## 2022-02-10 ENCOUNTER — Ambulatory Visit (INDEPENDENT_AMBULATORY_CARE_PROVIDER_SITE_OTHER): Payer: 59 | Admitting: Radiology

## 2022-02-10 ENCOUNTER — Encounter: Payer: Self-pay | Admitting: Radiology

## 2022-02-10 VITALS — BP 120/82 | Ht 64.5 in | Wt 192.0 lb

## 2022-02-10 DIAGNOSIS — N939 Abnormal uterine and vaginal bleeding, unspecified: Secondary | ICD-10-CM | POA: Diagnosis not present

## 2022-02-10 LAB — PREGNANCY, URINE: Preg Test, Ur: NEGATIVE

## 2022-02-10 MED ORDER — NORETHIN-ETH ESTRAD-FE BIPHAS 1 MG-10 MCG / 10 MCG PO TABS: 1.0000 | ORAL_TABLET | Freq: Every day | ORAL | 0 refills | Status: DC

## 2022-02-10 NOTE — Progress Notes (Signed)
   Julie Hudson 1995-09-28 [id number]   History:  26 y.o. here as a new patient. Just moved from [place]. Complains of vaginal bleeding x's 2 months. On continuous ocps, missed a few pills.  Stopped ocps 1 week ago then started very heavy bleeding/passed clots.  Patient had an old prescription of aygestin and took 2 tablets bid x's 2 days (patient hasn't taken meds today).  Bleeding stopped last night. Last AEX 2022 (GYN was unable to complete exam--due to patient's stress/discomfort/vaginismus---Gyn gave her dilators to use). Desires to schedule AEX appointment at another time   Gynecologic History No LMP recorded. (Menstrual status: Oral contraceptives). Period Pattern: (!) Irregular Menstrual Flow: Heavy Dysmenorrhea: (!) Moderate Dysmenorrhea Symptoms: Cramping Contraception/Family planning: abstinence Sexually active: no   Obstetric History OB History  Gravida Para Term Preterm AB Living  0 0 0 0 0 0  SAB IAB Ectopic Multiple Live Births  0 0 0 0 0     The following portions of the patient's history were reviewed and updated as appropriate: allergies, current medications, past family history, past medical history, past social history, past surgical history, and problem list.  Review of Systems Pertinent items noted in HPI and remainder of comprehensive ROS otherwise negative.   Past medical history, past surgical history, family history and social history were all reviewed and documented in the EPIC chart.   Exam:  Vitals:   02/10/22 1058  BP: 120/82  Weight: 192 lb (87.1 kg)  Height: 5' 4.5" (1.638 m)   Body mass index is 32.45 kg/m.  General appearance:  Normal Abdominal  Soft,nontender, without masses, guarding or rebound.  Liver/spleen:  No organomegaly noted  Hernia:  None appreciated Genitourinary   Inguinal/mons:  Normal without inguinal adenopathy  External genitalia:  Normal appearing vulva with no masses, tenderness, or lesions  BUS/Urethra/Skene's  glands:  Normal without masses or exudate  Unable to complete speculum exam due to vaginismus  Uterus:  ? Enlargement. Patient was able to shortly tolerate a 1 finger bimanual exam, possible uterine enlargement, difficult to dissern, patient very tense  Adnexa/parametria:     Rt: Normal in size, without masses or tenderness.   Lt: Normal in size, without masses or tenderness.   Chaperone offered and declined  Assessment/Plan:   1. Abnormal uterine bleeding Begin LoLoestrin today, stop aygestin. Return for u/s  - Pregnancy, urine - US Transvaginal Non-OB; Future - Norethindrone-Ethinyl Estradiol-Fe Biphas (LO LOESTRIN FE) 1 MG-10 MCG / 10 MCG tablet; Take 1 tablet by mouth daily.  Dispense: 84 tablet; Refill: 0     Julie Hudson B WHNP-BC 11:35 AM 02/10/2022

## 2022-02-24 ENCOUNTER — Ambulatory Visit (INDEPENDENT_AMBULATORY_CARE_PROVIDER_SITE_OTHER): Payer: 59

## 2022-02-24 ENCOUNTER — Other Ambulatory Visit: Payer: 59 | Admitting: Radiology

## 2022-02-24 DIAGNOSIS — N939 Abnormal uterine and vaginal bleeding, unspecified: Secondary | ICD-10-CM | POA: Diagnosis not present

## 2023-04-10 ENCOUNTER — Ambulatory Visit (INDEPENDENT_AMBULATORY_CARE_PROVIDER_SITE_OTHER): Payer: PRIVATE HEALTH INSURANCE

## 2023-04-10 ENCOUNTER — Ambulatory Visit (INDEPENDENT_AMBULATORY_CARE_PROVIDER_SITE_OTHER): Payer: PRIVATE HEALTH INSURANCE | Admitting: Podiatry

## 2023-04-10 ENCOUNTER — Ambulatory Visit: Payer: Self-pay

## 2023-04-10 DIAGNOSIS — M7752 Other enthesopathy of left foot: Secondary | ICD-10-CM | POA: Diagnosis not present

## 2023-04-10 DIAGNOSIS — M21962 Unspecified acquired deformity of left lower leg: Secondary | ICD-10-CM

## 2023-04-10 DIAGNOSIS — M2142 Flat foot [pes planus] (acquired), left foot: Secondary | ICD-10-CM

## 2023-04-10 DIAGNOSIS — M2141 Flat foot [pes planus] (acquired), right foot: Secondary | ICD-10-CM

## 2023-04-10 DIAGNOSIS — M21961 Unspecified acquired deformity of right lower leg: Secondary | ICD-10-CM | POA: Diagnosis not present

## 2023-04-10 MED ORDER — METHYLPREDNISOLONE 4 MG PO TBPK: ORAL_TABLET | ORAL | 0 refills | Status: DC

## 2023-04-10 NOTE — Progress Notes (Unsigned)
      Chief Complaint  Patient presents with   Foot Pain    Left foot pain mostly near ankle pain has been there for about 2 weeks    HPI: 27 y.o. female presents today with concern of some ankle discomfort on the left as well as flattened feet bilateral.  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  Denies any trauma to the foot or ankle.  And notes pain is aggravated with prolonged standing on the left foot.  Past Medical History:  Diagnosis Date   ADHD    History reviewed. No pertinent surgical history.  Allergies  Allergen Reactions   Fish Allergy Anaphylaxis   Peanut Oil Anaphylaxis   Physical Exam: General: The patient is alert and oriented x3 in no acute distress.  Dermatology: Skin is warm, dry and supple bilateral lower extremities. Interspaces are clear of maceration and debris.    Vascular: Palpable pedal pulses bilaterally. Capillary refill within normal limits.  No appreciable edema.  No erythema or calor.  Neurological: Light touch sensation grossly intact bilateral feet.   Musculoskeletal Exam: Flexible flatfoot bilateral.  Positive windlass mechanism bilateral.  No pain on palpation of the posterior tibial tendon bilateral.  With dorsiflexion and eversion of the left foot there is some impingement near the anterolateral ankle/sinus tarsi joint.    Radiographic Exam (bilateral foot, 3 weightbearing views, 04/10/2023):  Normal osseous mineralization. Joint spaces preserved.  Significant flatfoot deformity indicated by calcaneal position being parallel to the weightbearing surface bilateral.   first ray elevatus bilateral.    Assessment/Plan of Care: 1. Capsulitis of ankle, left   2. Pes planus of both feet   3. Deformity of foot, left   4. Deformity of foot, right      Meds ordered this encounter  Medications   methylPREDNISolone (MEDROL DOSEPAK) 4 MG TBPK tablet    Sig: Take as directed    Dispense:  21 tablet    Refill:  0    DG FOOT COMPLETE RIGHT DG FOOT COMPLETE LEFT DG ANKLE 2 VIEWS LEFT FOR HOME USE ONLY DME CUSTOM ORTHOTICS  Discussed clinical and radiographic findings with patient today.  Strongly recommend custom orthotics for the patient today due to her significant flatfoot deformity bilateral.  With the type of standing and walking that she has in her near future she would greatly benefit from the use to improve foot function, support deformity, and decreased pain.  Will prescribe Medrol Dosepak for the patient to calm down the inflammation in the left anterolateral ankle/sinus tarsi joint.  Will hold off on a cortisone injection at this time.  Follow-up in 2 to 3 months or as needed   Clerance Lav, DPM, FACFAS [facility]     [address] [place], [place] [address]                Office [phone]  Fax [phone]

## 2023-04-12 ENCOUNTER — Encounter: Payer: Self-pay | Admitting: Podiatry

## 2023-04-26 NOTE — Progress Notes (Signed)
Orthotics ordered today  Will be fit when in  [place]

## 2023-05-16 ENCOUNTER — Encounter: Payer: Self-pay | Admitting: Podiatry

## 2023-05-16 MED ORDER — MELOXICAM 7.5 MG PO TABS: 7.5000 mg | ORAL_TABLET | Freq: Every day | ORAL | 0 refills | Status: DC

## 2023-06-12 ENCOUNTER — Other Ambulatory Visit: Payer: Self-pay | Admitting: Podiatry

## 2023-06-19 ENCOUNTER — Encounter: Payer: Self-pay | Admitting: Podiatry

## 2023-06-19 ENCOUNTER — Other Ambulatory Visit: Payer: Self-pay | Admitting: Podiatry

## 2023-06-19 DIAGNOSIS — G44219 Episodic tension-type headache, not intractable: Secondary | ICD-10-CM | POA: Insufficient documentation

## 2023-06-19 MED ORDER — DICLOFENAC SODIUM 75 MG PO TBEC: 75.0000 mg | DELAYED_RELEASE_TABLET | Freq: Two times a day (BID) | ORAL | 0 refills | Status: DC

## 2023-07-03 ENCOUNTER — Encounter: Payer: Self-pay | Admitting: Podiatry

## 2023-07-03 ENCOUNTER — Ambulatory Visit (INDEPENDENT_AMBULATORY_CARE_PROVIDER_SITE_OTHER): Payer: PRIVATE HEALTH INSURANCE | Admitting: Podiatry

## 2023-07-03 DIAGNOSIS — M2141 Flat foot [pes planus] (acquired), right foot: Secondary | ICD-10-CM | POA: Diagnosis not present

## 2023-07-03 DIAGNOSIS — M21962 Unspecified acquired deformity of left lower leg: Secondary | ICD-10-CM

## 2023-07-03 DIAGNOSIS — M7752 Other enthesopathy of left foot: Secondary | ICD-10-CM | POA: Diagnosis not present

## 2023-07-03 DIAGNOSIS — M21961 Unspecified acquired deformity of right lower leg: Secondary | ICD-10-CM | POA: Diagnosis not present

## 2023-07-03 DIAGNOSIS — M2142 Flat foot [pes planus] (acquired), left foot: Secondary | ICD-10-CM

## 2023-07-03 NOTE — Progress Notes (Signed)
       Chief Complaint  Patient presents with   Ankle Pain    She reports she did not get orthotics yet but diclofenac did help.     HPI: 27 y.o. female presents today for orthotic pickup.  She notes that she has been taking the diclofenac since last seen and feels like this has been helping her discomfort.  She notes that after 3 to 4 hours standing on her feet, she will have moderate aching and pain to the ankles and foot bilateral.  And denies injury since last seen.  Past Medical History:  Diagnosis Date   ADHD     No past surgical history on file.  Allergies  Allergen Reactions   Fish Allergy Anaphylaxis   Peanut Oil Anaphylaxis    Physical Exam: There are palpable pedal pulses.  No significant edema is noted bilateral.  Exam findings are minimally improved from 04/10/2023 visit  Assessment/Plan of Care: 1. Capsulitis of ankle, left   2. Pes planus of both feet     Discussed clinical findings with patient today.  Patient's custom orthotics were fitted and dispensed today.  She did not have enclosed sneakers to place these in today so she was instructed on how to trim the top-cover that to fit into her shoes at home which it turns out that they are too long.  Reviewed break-in.  Over the next 2 weeks with the patient.  The orthotic was ordered to have a deep heel cup however, they seem to have a low heel cup when dispensed.  If she needs any further modifications, would need to change to a deeper heel cup with possible medial flange bilateral  Follow-up as needed.  She will continue with the diclofenac EC 75 mg 1 tablet twice daily as needed for pain.  If she does not have further improvement in 1 month then we will set up for MRI to evaluate for talar dome injury or synovitis within the ankle joint.  As she still is not wanting a cortisone injection into the joint  Annaliz Aven D. Oscar Forman, DPM, FACFAS [facility]     [address] [place], [place] [address]                Office [phone]  Fax [phone]

## 2023-07-17 ENCOUNTER — Other Ambulatory Visit: Payer: Self-pay | Admitting: Podiatry

## 2023-08-09 ENCOUNTER — Ambulatory Visit (INDEPENDENT_AMBULATORY_CARE_PROVIDER_SITE_OTHER): Payer: PRIVATE HEALTH INSURANCE | Admitting: Family Medicine

## 2023-08-09 ENCOUNTER — Encounter: Payer: Self-pay | Admitting: Family Medicine

## 2023-08-09 VITALS — BP 117/77 | HR 90 | Temp 98.8°F | Resp 90 | Ht 62.0 in | Wt 202.2 lb

## 2023-08-09 DIAGNOSIS — Z7689 Persons encountering health services in other specified circumstances: Secondary | ICD-10-CM | POA: Insufficient documentation

## 2023-08-09 DIAGNOSIS — F909 Attention-deficit hyperactivity disorder, unspecified type: Secondary | ICD-10-CM | POA: Diagnosis not present

## 2023-08-09 DIAGNOSIS — Z6836 Body mass index (BMI) 36.0-36.9, adult: Secondary | ICD-10-CM | POA: Insufficient documentation

## 2023-08-09 NOTE — Progress Notes (Signed)
New Patient Office Visit  Subjective    Patient ID: Julie Hudson, female    DOB: [birth year]  Age: 28 y.o. MRN: [id number]  CC:  Chief Complaint  Patient presents with   New Patient (Initial Visit)   Establish Care    Starting meds back for ADHD and weight loss medications.    HPI Labrittany Hudson presents to establish care with this practice. She is new to me. 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   ADHD: has not been on medication for 6 years.  Study for boards and cannot focus.  Was taking Adderall 10 mg daily.  PDMP: no red flags.   Weight loss : has not been on medication before. Track calories and exercise.  Call insurance to find out if weight loss medications are covered.    Outpatient Encounter Medications as of 08/09/2023  Medication Sig   albuterol (ACCUNEB) 1.25 MG/3ML nebulizer solution Take 3 mLs (1.25 mg total) by nebulization every 6 (six) hours as needed for up to 60 doses for wheezing.   albuterol (VENTOLIN HFA) 108 (90 Base) MCG/ACT inhaler Inhale 2 puffs into the lungs every 4 (four) hours as needed for wheezing or shortness of breath.   diclofenac (VOLTAREN) 75 MG EC tablet TAKE 1 TABLET BY MOUTH TWICE A DAY   levonorgestrel (MIRENA) 20 MCG/DAY IUD 1 each by Intrauterine route once.   No facility-administered encounter medications on file as of 08/09/2023.    Past Medical History:  Diagnosis Date   ADHD     Past Surgical History:  Procedure Laterality Date   INTRAUTERINE DEVICE INSERTION      Family History  Problem Relation Age of Onset   Diabetes Father    Breast cancer Paternal Grandmother        unsure of onset    Social History   Socioeconomic History   Marital status: Married    Spouse name: Marvene Staff   Number of children: 0   Years of education: Not on file   Highest education level: Not on file  Occupational History   Not on file  Tobacco Use   Smoking status: Never   Smokeless tobacco: Never   Vaping Use   Vaping status: Never Used  Substance and Sexual Activity   Alcohol use: Yes    Comment: occasionally   Drug use: Never   Sexual activity: Yes    Partners: Male  Other Topics Concern   Not on file  Social History Narrative   Not on file   Social Drivers of Health   Financial Resource Strain: Low Risk  (12/09/2022)   Received from [facility]   Overall Financial Resource Strain (CARDIA)    Difficulty of Paying Living Expenses: Not hard at all  Food Insecurity: No Food Insecurity (12/09/2022)   Received from [facility]   Hunger Vital Sign    Worried About Running Out of Food in the Last Year: Never true    Ran Out of Food in the Last Year: Never true  Transportation Needs: No Transportation Needs (12/09/2022)   Received from [facility]   [facility] - Transportation    Lack of Transportation (Medical): No    Lack of Transportation (Non-Medical): No  Physical Activity: Patient Declined (12/09/2022)   Received from [facility]   Exercise Vital Sign    Days of Exercise per Week: Patient declined    Minutes of Exercise per Session: Patient declined  Stress: Patient Declined (12/09/2022)   Received  from [facility]   [employer] [facility] Occupational [facility] - Occupational Stress Questionnaire    Feeling of Stress : Patient declined  Social Connections: Patient Declined (12/09/2022)   Received from [facility]   Social Connection and Isolation Panel [NHANES]    Frequency of Communication with Friends and Family: Patient declined    Frequency of Social Gatherings with Friends and Family: Patient declined    Attends Religious Services: Patient declined    Database administrator or Organizations: Patient declined    Attends Banker Meetings: Patient declined    Marital Status: Patient declined  Intimate Partner Violence: Not At Risk (12/09/2022)   Received from [facility]   Humiliation, Afraid, Rape, and Kick questionnaire     Fear of Current or Ex-Partner: No    Emotionally Abused: No    Physically Abused: No    Sexually Abused: No    ROS      Objective    BP 117/77   Pulse 90   Temp 98.8 F (37.1 C) (Oral)   Resp (!) 90   Ht 5\' 2"  (1.575 m)   Wt 202 lb 3.2 oz (91.7 kg)   SpO2 97%   BMI 36.98 kg/m   Physical Exam Vitals and nursing note reviewed.  Constitutional:      General: She is not in acute distress.    Appearance: Normal appearance. She is obese.  Cardiovascular:     Rate and Rhythm: Normal rate and regular rhythm.     Heart sounds: Normal heart sounds.  Pulmonary:     Effort: Pulmonary effort is normal.     Breath sounds: Normal breath sounds.  Skin:    General: Skin is warm and dry.  Neurological:     General: No focal deficit present.     Mental Status: She is alert. Mental status is at baseline.  Psychiatric:        Mood and Affect: Mood normal.        Behavior: Behavior normal.        Thought Content: Thought content normal.        Judgment: Judgment normal.       Assessment & Plan:   Problem List Items Addressed This Visit     Attention deficit hyperactivity disorder (ADHD)   Reports taking low dose Adderall in past. Has not been on medication for a long while. 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  Urine tox screen today. Once this is back, will start Adderall 10  mg daily.  Follow-up in one month for medication management.       Relevant Orders   ToxASSURE Select 13 (MW), Urine   Establishing care with new doctor, encounter for - Primary   BMI 36.0-36.9,adult   Discussed weight loss medications and insurance coverage. Will track food and exercise.  She will call insurance company to inquire about coverage.  Will likely need to start metformin with diet changes and exercise.      Agrees with plan of care discussed.  Questions answered.  Total time face to face discussing ADHD and weight loss: 40 minutes.    Return  in about 4 weeks (around 09/06/2023) for ADHD for medcation and weight loss .   Julie Olive, FNP

## 2023-08-09 NOTE — Assessment & Plan Note (Signed)
Reports taking low dose Adderall in past. Has not been on medication for a long while. 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  Urine tox screen today. Once this is back, will start Adderall 10  mg daily.  Follow-up in one month for medication management.

## 2023-08-09 NOTE — Assessment & Plan Note (Signed)
Discussed weight loss medications and insurance coverage. Will track food and exercise.  She will call insurance company to inquire about coverage.  Will likely need to start metformin with diet changes and exercise.

## 2023-08-11 ENCOUNTER — Encounter: Payer: Self-pay | Admitting: Family Medicine

## 2023-08-11 ENCOUNTER — Other Ambulatory Visit: Payer: Self-pay | Admitting: Family Medicine

## 2023-08-11 DIAGNOSIS — F909 Attention-deficit hyperactivity disorder, unspecified type: Secondary | ICD-10-CM

## 2023-08-11 LAB — TOXASSURE SELECT 13 (MW), URINE

## 2023-08-11 MED ORDER — AMPHETAMINE-DEXTROAMPHETAMINE 10 MG PO TABS: 10.0000 mg | ORAL_TABLET | Freq: Every day | ORAL | 0 refills | Status: AC

## 2023-08-13 ENCOUNTER — Encounter: Payer: Self-pay | Admitting: Family Medicine

## 2023-08-26 ENCOUNTER — Encounter: Payer: Self-pay | Admitting: Podiatry

## 2023-09-06 ENCOUNTER — Ambulatory Visit: Payer: PRIVATE HEALTH INSURANCE | Admitting: Family Medicine

## 2024-08-23 ENCOUNTER — Encounter: Payer: Self-pay | Admitting: Podiatry
